# Patient Record
Sex: Female | Born: 1935 | Race: White | Hispanic: No | Marital: Single | Smoking: Former smoker
Health system: Southern US, Community
[De-identification: ages and names within clinical notes are randomized; demographics above are authoritative.]

## PROBLEM LIST (undated history)

## (undated) DIAGNOSIS — N813 Complete uterovaginal prolapse: Secondary | ICD-10-CM

## (undated) DIAGNOSIS — I10 Essential (primary) hypertension: Secondary | ICD-10-CM

## (undated) DIAGNOSIS — N289 Disorder of kidney and ureter, unspecified: Secondary | ICD-10-CM

## (undated) DIAGNOSIS — N812 Incomplete uterovaginal prolapse: Secondary | ICD-10-CM

## (undated) DIAGNOSIS — M810 Age-related osteoporosis without current pathological fracture: Secondary | ICD-10-CM

## (undated) DIAGNOSIS — Z9889 Other specified postprocedural states: Secondary | ICD-10-CM

## (undated) DIAGNOSIS — N814 Uterovaginal prolapse, unspecified: Secondary | ICD-10-CM

## (undated) DIAGNOSIS — F0390 Unspecified dementia without behavioral disturbance: Secondary | ICD-10-CM

## (undated) DIAGNOSIS — G459 Transient cerebral ischemic attack, unspecified: Secondary | ICD-10-CM

## (undated) DIAGNOSIS — Z8679 Personal history of other diseases of the circulatory system: Secondary | ICD-10-CM

## (undated) DIAGNOSIS — E039 Hypothyroidism, unspecified: Secondary | ICD-10-CM

## (undated) DIAGNOSIS — E785 Hyperlipidemia, unspecified: Secondary | ICD-10-CM

## (undated) DIAGNOSIS — K219 Gastro-esophageal reflux disease without esophagitis: Secondary | ICD-10-CM

## (undated) DIAGNOSIS — R112 Nausea with vomiting, unspecified: Secondary | ICD-10-CM

## (undated) DIAGNOSIS — N3941 Urge incontinence: Secondary | ICD-10-CM

## (undated) DIAGNOSIS — D369 Benign neoplasm, unspecified site: Secondary | ICD-10-CM

## (undated) HISTORY — DX: Hyperlipidemia, unspecified: E78.5

## (undated) HISTORY — DX: Personal history of other diseases of the circulatory system: Z86.79

## (undated) HISTORY — DX: Age-related osteoporosis without current pathological fracture: M81.0

## (undated) HISTORY — DX: Hypothyroidism, unspecified: E03.9

## (undated) HISTORY — DX: Urge incontinence: N39.41

## (undated) HISTORY — DX: Complete uterovaginal prolapse: N81.3

## (undated) HISTORY — DX: Uterovaginal prolapse, unspecified: N81.4

## (undated) HISTORY — DX: Disorder of kidney and ureter, unspecified: N28.9

## (undated) HISTORY — DX: Unspecified dementia, unspecified severity, without behavioral disturbance, psychotic disturbance, mood disturbance, and anxiety: F03.90

## (undated) HISTORY — DX: Incomplete uterovaginal prolapse: N81.2

## (undated) HISTORY — DX: Benign neoplasm, unspecified site: D36.9

---

## 1989-02-06 HISTORY — PX: CATARACT EXTRACTION W/ INTRAOCULAR LENS  IMPLANT, BILATERAL: SHX1307

## 2004-09-02 ENCOUNTER — Other Ambulatory Visit: Admission: RE | Admit: 2004-09-02 | Discharge: 2004-09-02 | Payer: Self-pay | Admitting: Internal Medicine

## 2006-06-08 HISTORY — PX: WRIST FRACTURE SURGERY: SHX121

## 2007-01-04 ENCOUNTER — Ambulatory Visit: Payer: Self-pay | Admitting: Internal Medicine

## 2007-02-08 ENCOUNTER — Ambulatory Visit: Payer: Self-pay | Admitting: Internal Medicine

## 2007-03-03 ENCOUNTER — Ambulatory Visit: Payer: Self-pay | Admitting: Internal Medicine

## 2007-03-03 DIAGNOSIS — K648 Other hemorrhoids: Secondary | ICD-10-CM | POA: Insufficient documentation

## 2007-03-03 DIAGNOSIS — K5730 Diverticulosis of large intestine without perforation or abscess without bleeding: Secondary | ICD-10-CM | POA: Insufficient documentation

## 2007-09-19 DIAGNOSIS — D126 Benign neoplasm of colon, unspecified: Secondary | ICD-10-CM

## 2007-09-19 DIAGNOSIS — E7800 Pure hypercholesterolemia, unspecified: Secondary | ICD-10-CM

## 2007-12-13 ENCOUNTER — Emergency Department (HOSPITAL_COMMUNITY): Admission: EM | Admit: 2007-12-13 | Discharge: 2007-12-14 | Payer: Self-pay | Admitting: Emergency Medicine

## 2007-12-14 ENCOUNTER — Encounter: Admission: RE | Admit: 2007-12-14 | Discharge: 2007-12-14 | Payer: Self-pay | Admitting: Orthopedic Surgery

## 2007-12-15 ENCOUNTER — Ambulatory Visit (HOSPITAL_BASED_OUTPATIENT_CLINIC_OR_DEPARTMENT_OTHER): Admission: RE | Admit: 2007-12-15 | Discharge: 2007-12-15 | Payer: Self-pay | Admitting: Orthopedic Surgery

## 2008-01-19 ENCOUNTER — Encounter: Admission: RE | Admit: 2008-01-19 | Discharge: 2008-01-19 | Payer: Self-pay | Admitting: Internal Medicine

## 2008-03-08 ENCOUNTER — Ambulatory Visit: Payer: Self-pay | Admitting: Internal Medicine

## 2008-07-19 ENCOUNTER — Ambulatory Visit: Payer: Self-pay | Admitting: Internal Medicine

## 2008-09-17 ENCOUNTER — Encounter: Admission: RE | Admit: 2008-09-17 | Discharge: 2008-11-14 | Payer: Self-pay | Admitting: Neurology

## 2009-01-21 ENCOUNTER — Ambulatory Visit: Payer: Self-pay | Admitting: Internal Medicine

## 2009-02-07 ENCOUNTER — Ambulatory Visit: Payer: Self-pay | Admitting: Internal Medicine

## 2009-02-07 ENCOUNTER — Encounter: Admission: RE | Admit: 2009-02-07 | Discharge: 2009-02-07 | Payer: Self-pay | Admitting: Internal Medicine

## 2009-02-21 ENCOUNTER — Ambulatory Visit: Payer: Self-pay | Admitting: Internal Medicine

## 2009-06-20 ENCOUNTER — Ambulatory Visit: Payer: Self-pay | Admitting: Internal Medicine

## 2009-07-23 ENCOUNTER — Ambulatory Visit: Payer: Self-pay | Admitting: Internal Medicine

## 2010-01-02 ENCOUNTER — Ambulatory Visit: Payer: Self-pay | Admitting: Internal Medicine

## 2010-01-14 ENCOUNTER — Ambulatory Visit: Payer: Self-pay | Admitting: Internal Medicine

## 2010-07-24 ENCOUNTER — Encounter (INDEPENDENT_AMBULATORY_CARE_PROVIDER_SITE_OTHER): Payer: Medicare Other | Admitting: Internal Medicine

## 2010-07-24 DIAGNOSIS — F0280 Dementia in other diseases classified elsewhere without behavioral disturbance: Secondary | ICD-10-CM

## 2010-07-24 DIAGNOSIS — E785 Hyperlipidemia, unspecified: Secondary | ICD-10-CM

## 2010-07-24 DIAGNOSIS — Z23 Encounter for immunization: Secondary | ICD-10-CM

## 2010-07-24 DIAGNOSIS — E039 Hypothyroidism, unspecified: Secondary | ICD-10-CM

## 2010-07-24 DIAGNOSIS — N182 Chronic kidney disease, stage 2 (mild): Secondary | ICD-10-CM

## 2010-08-26 ENCOUNTER — Encounter: Payer: Self-pay | Admitting: *Deleted

## 2010-09-15 ENCOUNTER — Ambulatory Visit (INDEPENDENT_AMBULATORY_CARE_PROVIDER_SITE_OTHER): Payer: Medicare Other | Admitting: Internal Medicine

## 2010-09-15 DIAGNOSIS — J209 Acute bronchitis, unspecified: Secondary | ICD-10-CM

## 2010-10-21 NOTE — Assessment & Plan Note (Signed)
[facility]                         GASTROENTEROLOGY OFFICE NOTE   WAVERLY, TARQUINIO                        MRN:          [id number]  DATE:01/04/2007                            DOB:          July 19, 1920    REASON FOR EVALUATION:  Surveillance colonoscopy.   HISTORY:  This is a pleasant, healthy 77 year old white female with a  history of adenomatous colon polyps.  She presents today regarding  surveillance colonoscopy.  Her initial colonoscopy was performed in  December of 2002.  At that time, she was found to have a large polypoid  mass in the cecum which was resected.  This was found to be a  tubulovillous adenoma with focal high grade dysplasia.  Followup in 1  year was recommended.  However, the patient did not return for followup  until January of 2005.  At that time, she was found to have multiple  adenomatous colon polyps which were small and removed.  The area of  prior polypectomy was without evidence of recurrent neoplasia.  She is  also noted to have severe diverticulosis.  At the time of her last exam,  followup in 2 years was recommended.  She presents at this time.  Currently, her GI review of systems is negative.  No nausea, vomiting,  abdominal pain, change in bowel habits or bleeding.  As well, overall  health has been good.   PAST MEDICAL HISTORY:  Hyperlipidemia.   PAST SURGICAL HISTORY:  None.   ALLERGIES:  PENICILLIN.   CURRENT MEDICATIONS:  1. Lipitor 10 mg daily.  2. Synthroid unspecified dosage daily.  3. Fosamax once weekly.   FAMILY HISTORY:  Negative for gastrointestinal malignancy.   SOCIAL HISTORY:  The patient has 4 children, lives alone, attended  college, does not smoke or use alcohol.   REVIEW OF SYSTEMS:  Per diagnostic evaluation form.   PHYSICAL EXAMINATION:  Well-appearing female in no acute distress.  Blood pressure is 142/76, heart rate is 80, weight is 149.6 pounds.  She  is 5 feet 3 inches in  height.  HEENT:  Sclerae anicteric.  Conjunctivae are pink.  Oral mucosa intact.  No adenopathy.  LUNGS:  Clear.  HEART:  Regular.  ABDOMEN:  Soft without tenderness, masses, or hernia.  Good bowel sounds  heard.   IMPRESSION:  1. An 77 year old female in excellent health with a history of      advanced adenomatous colon polyps, as well as multiple colon polyps      as discussed above.  Currently due for surveillance.  Despite her      advanced age, she is an excellent candidate without      contraindication.  We discussed the pros and cons of proceeding      with surveillance colonoscopy.  2. History of diverticulosis without diverticulitis.   PLAN:  Colonoscopy with polypectomy if necessary.  The nature of the  procedure, as well as the risks, benefits, and alternatives were again  reviewed in detail.  She understood and agreed to proceed.     Wilhemina Bonito. Marina Goodell, MD  Electronically Signed  JNP/MedQ  DD: 01/04/2007  DT: 01/05/2007  Job #: [id number]   cc:   Luanna Cole. Lenord Fellers, M.D.

## 2010-10-21 NOTE — Op Note (Signed)
[name], [name]                 ACCOUNT NO.:  [id number]   MEDICAL RECORD NO.:  [id number]          PATIENT TYPE:  AMB   LOCATION:  DSC                          FACILITY:  [facility]   PHYSICIAN:  Katy Fitch. Gay, M.D. DATE OF BIRTH:  11/18/1920   DATE OF PROCEDURE:  12/15/2007  DATE OF DISCHARGE:                               OPERATIVE REPORT   PREOPERATIVE DIAGNOSIS:  Comminuted displaced intra-articular fracture  of left distal radius with marked metaphyseal impaction and ulnar  styloid fracture.   POSTOPERATIVE DIAGNOSIS:  Comminuted displaced intra-articular fracture  of left distal radius with marked metaphyseal impaction and ulnar  styloid fracture.   OPERATIONS:  Open reduction and internal fixation of right distal radius  utilizing a 7 peg distal volar radial standard plate system.   OPERATING SURGEON:  Katy Fitch. Sypher, MD   ASSISTANT:  Laura Reeks Dasnoit, PA-C   ANESTHESIA:  Left infraclavicular block, supplemented by IV sedation.   SUPERVISING ANESTHESIOLOGIST:  Laura Person, MD   INDICATIONS:  [name] is an 77 year old right-hand dominant woman  who fell on December 13, 2007.  She was seen at the [facility] Emergency  Room where x-rays revealed a comminuted and impacted fracture of the  left distal radius.   I have treated her daughter, grandson, and multiple other family members  for upper extremity orthopedic predicaments.   She requested an upper extremity orthopedic consult.  She was seen by  [name] of the [facility] Emergency Room staff and was placed in  a sugar-tong splint, subsequently referred for follow up at orthopedic  and hand specialist.   CLINICAL EXAMINATION:  She was noted to have intact sensibility in her  median distribution, radial distribution, and ulnar distribution.  Her  motor function was intact.  Her x-rays prior to splinting revealed a  comminuted, impacted, and intra-articular displaced fracture of the left  distal  radius.   I had a lengthy informed consent with [name] and her son regarding  treatment options.  I recommended open reduction and internal fixation  utilizing a volar plate system.   Preoperatively, we advised that she may benefit from bone grafting.  After informed consent, she was brought to the operating room at this  time.   Due to other background medical issues we elected to proceed with a  regional block anesthesia, rather than a general anesthetic.   She was interviewed by [name] in the holding area and after informed  consent, had a regional block placed without complication.   PROCEDURE:  [name] was brought to the operating room and placed  in supine position on the table.   Following placement of an infraclavicular block in the holding area,  excellent anesthesia of the left upper extremity was achieved.   She was brought to room 8, placed in supine position up on the operating  table and 1 g of Ancef delivered as an IV prophylactic antibiotic.   The left arm was prepped with Betadine soap solution and sterilely  draped.  A pneumatic tourniquet was applied at proximal  left brachium.   Following exsanguination of the left arm with Esmarch bandage, arterial  tourniquet was inflated to 240 mmHg due to systolic hypertension.   Procedure commenced with exposure of the volar radius through a standard  DVR extensile exposure.  Incision was fashioned directly overlying the  flexor carpi radialis.  Subcutaneous tissues were carefully divided  taking care to identify and spare the radial artery and its superficial  branch.  The flexor pollicis longus was retracted in an ulnar direction.  The pronator quadratus was elevated and the fracture site exposed.  The  fracture was manipulated under open conditions, restoring anatomic  alignment of the articular surface and restoring the length, radial  slope, and tilt.   AP lateral and oblique images were obtained  documenting adequate  reduction followed by application of a DVR-7 peg plate system.  After  completion of the plate application, multiple images documented anatomic  reduction of the radius and restoration of radial length, tilt, and  slope.  There was excellent buttressing of the articular surface with  the peg's and screws.   The plate was secured with 4 screws to the radial shaft followed by  irrigation of the wound.  The pronator quadratus was repaired 90% over  the plate, taking care to protect the distal volar aspect of the plate.  This was repaired with a running suture of 0-Vicryl.   The wound was then irrigated a second time and repaired with  subcutaneous suture of 3-0 Vicryl and intradermal 3-0 Prolene with Steri-  Strips.   A light sugar-tong splint was applied for postoperative protection.  There were no apparent complications.   [name] tolerated the surgery and anesthesia well.  She was transferred  to recovery room with stable signs.   She will be discharged in a sling to the care of her family with  prescription for Percocet 5 mg one p.o. q.4-6 hours p.r.n. pain, also  doxycycline 100 mg p.o. b.i.d. x7 days as a prophylactic antibiotic.     Katy Fitch Gay, M.D.  Electronically Signed    RVS/MEDQ  D:  12/15/2007  T:  12/15/2007  Job:  [id number]

## 2010-10-27 ENCOUNTER — Other Ambulatory Visit: Payer: Self-pay | Admitting: *Deleted

## 2010-10-27 MED ORDER — ATORVASTATIN CALCIUM 20 MG PO TABS: 20.0000 mg | ORAL_TABLET | Freq: Every day | ORAL | Status: DC

## 2010-11-10 ENCOUNTER — Other Ambulatory Visit: Payer: Self-pay | Admitting: *Deleted

## 2010-11-10 DIAGNOSIS — E039 Hypothyroidism, unspecified: Secondary | ICD-10-CM

## 2010-11-10 MED ORDER — LEVOTHYROXINE SODIUM 100 MCG PO TABS: 100.0000 ug | ORAL_TABLET | Freq: Every day | ORAL | Status: DC

## 2011-01-20 ENCOUNTER — Telehealth: Payer: Self-pay | Admitting: Internal Medicine

## 2011-01-20 DIAGNOSIS — F0391 Unspecified dementia with behavioral disturbance: Secondary | ICD-10-CM

## 2011-01-20 NOTE — Telephone Encounter (Signed)
The number you gave me does not go through. Please check his contact number in patient's chart.

## 2011-01-22 ENCOUNTER — Encounter: Payer: Self-pay | Admitting: Internal Medicine

## 2011-01-22 ENCOUNTER — Ambulatory Visit (INDEPENDENT_AMBULATORY_CARE_PROVIDER_SITE_OTHER): Payer: Medicare Other | Admitting: Internal Medicine

## 2011-01-22 VITALS — BP 120/80 | HR 70 | Temp 98.1°F | Ht 63.0 in | Wt 142.5 lb

## 2011-01-22 DIAGNOSIS — R82998 Other abnormal findings in urine: Secondary | ICD-10-CM

## 2011-01-22 DIAGNOSIS — E785 Hyperlipidemia, unspecified: Secondary | ICD-10-CM

## 2011-01-22 DIAGNOSIS — R8290 Unspecified abnormal findings in urine: Secondary | ICD-10-CM

## 2011-01-22 DIAGNOSIS — E039 Hypothyroidism, unspecified: Secondary | ICD-10-CM

## 2011-01-22 DIAGNOSIS — F0390 Unspecified dementia without behavioral disturbance: Secondary | ICD-10-CM

## 2011-01-22 DIAGNOSIS — R32 Unspecified urinary incontinence: Secondary | ICD-10-CM

## 2011-01-22 LAB — POCT URINALYSIS DIPSTICK
Blood, UA: NEGATIVE
Glucose, UA: NEGATIVE
Nitrite, UA: NEGATIVE
Protein, UA: NEGATIVE
Spec Grav, UA: 1.02
Urobilinogen, UA: NEGATIVE
pH, UA: 5

## 2011-01-23 ENCOUNTER — Encounter: Payer: Self-pay | Admitting: Internal Medicine

## 2011-01-23 ENCOUNTER — Other Ambulatory Visit: Payer: Medicare Other | Admitting: Internal Medicine

## 2011-01-23 DIAGNOSIS — F0390 Unspecified dementia without behavioral disturbance: Secondary | ICD-10-CM | POA: Insufficient documentation

## 2011-01-23 DIAGNOSIS — E039 Hypothyroidism, unspecified: Secondary | ICD-10-CM

## 2011-01-23 DIAGNOSIS — E785 Hyperlipidemia, unspecified: Secondary | ICD-10-CM

## 2011-01-23 DIAGNOSIS — R32 Unspecified urinary incontinence: Secondary | ICD-10-CM | POA: Insufficient documentation

## 2011-01-23 NOTE — Progress Notes (Signed)
  Subjective:    Patient ID: [name], female    DOB: [birth year], 77 y.o.   MRN: [id number]  HPI 77 year old white female who currently lives alone. Her family lives in town and is supportive. She has a history of  dementia, hyperlipidemia, hypothyroidism. Her son puts her medications out for her but at times he think she may forget to take him unless he reminds her by telephone. She's had one episode of urinary incontinence on a trip recently. They're concerned about a possible urinary tract infection. Patient realizes her memory is not good. Today she knows the president, cannot name the month but says it's Fall, realizes school be starting soon since her grandchildren are in school, knows the day of the week. Cannot name the year.  Her son, Jonny Ruiz, has asked me to write a letter to help get some assistance in the home for her with per parrying meals, remembering to take her medication, laundry, etc. This has been completed. Patient no longer drives. She seemed Lipitor without assistance. Able to dress herself. Sometimes doesn't realize her clothing is soiled.    Review of Systems     Objective:   Physical Exam neck is supple without thyromegaly, no carotid bruits, chest is clear to auscultation, cardiac exam regular rate and rhythm normal S1 and S2, extremities without edema. Memory exam stated above.        Assessment & Plan:  Dementia  Hyperlipidemia  Hypothyroidism Plan: Patient has appointment see neurologist for followup next week. She is also on Namenda and Aricept. I'll see her in 6 months at which time she will have physical examination and fasting lab work. Continue Lipitor, aspirin 81 mg daily, Synthroid 0.1mg  daily

## 2011-01-24 LAB — LIPID PANEL
Cholesterol: 184 mg/dL (ref 0–200)
HDL: 45 mg/dL (ref >39)
LDL Cholesterol: 121 mg/dL — ABNORMAL HIGH (ref 0–99)
Total CHOL/HDL Ratio: 4.1 Ratio
Triglycerides: 88 mg/dL (ref <150)
VLDL: 18 mg/dL (ref 0–40)

## 2011-01-24 LAB — HEPATIC FUNCTION PANEL
ALT: 16 U/L (ref 0–35)
Albumin: 3.8 g/dL (ref 3.5–5.2)
Indirect Bilirubin: 0.4 mg/dL (ref 0.0–0.9)
Total Protein: 6.3 g/dL (ref 6.0–8.3)

## 2011-01-24 LAB — URINE CULTURE

## 2011-01-25 ENCOUNTER — Encounter: Payer: Self-pay | Admitting: Internal Medicine

## 2011-02-23 ENCOUNTER — Other Ambulatory Visit: Payer: Self-pay | Admitting: Internal Medicine

## 2011-03-05 LAB — POCT HEMOGLOBIN-HEMACUE: Hemoglobin: 13.7

## 2011-04-29 ENCOUNTER — Encounter: Payer: Self-pay | Admitting: Internal Medicine

## 2011-07-07 ENCOUNTER — Telehealth: Payer: Self-pay | Admitting: Internal Medicine

## 2011-07-07 NOTE — Telephone Encounter (Signed)
Called [name] and advised him ok to wait until 3/1 CPE unless pt develops additional symptoms.  Have pt elevate feet when sitting.  Call for appt if she develops further symptoms.  P/Dr. Lenord Fellers probably due to age and being sedentary.  [name] verbalized understanding.

## 2011-08-07 ENCOUNTER — Encounter: Payer: Self-pay | Admitting: Internal Medicine

## 2011-08-07 ENCOUNTER — Ambulatory Visit (INDEPENDENT_AMBULATORY_CARE_PROVIDER_SITE_OTHER): Payer: Medicare Other | Admitting: Internal Medicine

## 2011-08-07 DIAGNOSIS — Z0000 Encounter for general adult medical examination without abnormal findings: Secondary | ICD-10-CM

## 2011-08-07 DIAGNOSIS — R319 Hematuria, unspecified: Secondary | ICD-10-CM

## 2011-08-07 DIAGNOSIS — E039 Hypothyroidism, unspecified: Secondary | ICD-10-CM

## 2011-08-07 DIAGNOSIS — R32 Unspecified urinary incontinence: Secondary | ICD-10-CM

## 2011-08-07 DIAGNOSIS — E559 Vitamin D deficiency, unspecified: Secondary | ICD-10-CM

## 2011-08-07 DIAGNOSIS — F0390 Unspecified dementia without behavioral disturbance: Secondary | ICD-10-CM

## 2011-08-07 DIAGNOSIS — E785 Hyperlipidemia, unspecified: Secondary | ICD-10-CM

## 2011-08-07 DIAGNOSIS — Z79899 Other long term (current) drug therapy: Secondary | ICD-10-CM

## 2011-08-07 LAB — CBC WITH DIFFERENTIAL/PLATELET
Basophils Absolute: 0 10*3/uL (ref 0.0–0.1)
Basophils Relative: 1 % (ref 0–1)
Eosinophils Absolute: 0.2 10*3/uL (ref 0.0–0.7)
HCT: 38.9 % (ref 36.0–46.0)
Hemoglobin: 12.1 g/dL (ref 12.0–15.0)
MCH: 30.1 pg (ref 26.0–34.0)
MCHC: 31.1 g/dL (ref 30.0–36.0)
Monocytes Absolute: 0.8 10*3/uL (ref 0.1–1.0)
Monocytes Relative: 10 % (ref 3–12)
Neutrophils Relative %: 60 % (ref 43–77)
RDW: 14.4 % (ref 11.5–15.5)

## 2011-08-07 LAB — COMPREHENSIVE METABOLIC PANEL
Alkaline Phosphatase: 21 U/L — ABNORMAL LOW (ref 39–117)
BUN: 25 mg/dL — ABNORMAL HIGH (ref 6–23)
Creat: 1.28 mg/dL — ABNORMAL HIGH (ref 0.50–1.10)
Glucose, Bld: 76 mg/dL (ref 70–99)
Total Bilirubin: 0.5 mg/dL (ref 0.3–1.2)

## 2011-08-07 LAB — POCT URINALYSIS DIPSTICK
Bilirubin, UA: NEGATIVE
Nitrite, UA: NEGATIVE
Protein, UA: NEGATIVE
pH, UA: 6.5

## 2011-08-07 LAB — LIPID PANEL
HDL: 46 mg/dL (ref >39)
LDL Cholesterol: 109 mg/dL — ABNORMAL HIGH (ref 0–99)
Total CHOL/HDL Ratio: 3.8 Ratio
Triglycerides: 88 mg/dL (ref <150)
VLDL: 18 mg/dL (ref 0–40)

## 2011-08-09 LAB — URINE CULTURE

## 2011-08-17 ENCOUNTER — Telehealth: Payer: Self-pay

## 2011-08-17 NOTE — Telephone Encounter (Signed)
Son, Jonny Ruiz inquiring about microscopic blood in urine. How concerned should he be. Can we recheck a u/a? If its there again, what's the significance?

## 2011-08-17 NOTE — Telephone Encounter (Signed)
I am not concerned about 1+ occult blood in urine. Culture had no significant growth. If he prefers, we can repeat urine specimen and send it to [facility] lab for microscopic and repeat culture. Or, we can treat her for a presumed infection and see if incontinence gets better.

## 2011-08-17 NOTE — Progress Notes (Signed)
Subjective:    Patient ID: [name], female    DOB: Nov 01, 1920, 78 y.o.   MRN: [id number]  HPI  78 year old white female with dementia, hyperlipidemia, hypothyroidism, urinary incontinence currently living with one of her sons. She is brought to the visit today by her other son, [name], who is quite concerned about how she is doing. Her appetite is good. She gets along well in the family environment. Family is supportive. She's able to have some help come and to assist her with bathing. She doesn't like to be assisted when going to the toilet but she's having some issues with incontinence. Has been wearing Depends.  History of hearing loss, osteoporosis, adenomatous colon polyps, cervical prolapse. Prior history of urge urinary incontinence which may be what is going on now said she doesn't realize she needs to go.  She is allergic to penicillin  She had 2 benign left breast biopsies in the 1980s and cataract extractions both eyes in 1995.  Last colonoscopy was in 2008. She is on Aricept and Namenda for dementia but it doesn't seem to be of any help. Patient realizes she is confused. She does note some things like a month, her birthday, day of week. Cannot tell me the year.  Sees Dr. Anne Hahn, neurologist. Last evaluated by him 08/03/2011. Patient tolerates Aricept and Namenda well. Sleeps pre-well and doesn't really want her much at night.  Nonsmoker. No alcohol consumption. She is a widow. Has 4 children. Previously resided in [place], [place] and came to live here in [place] to be closer to her family in 2004.  She took Fosamax for a number of years but we discontinued that in 2011. Takes a baby aspirin daily 81 mg. Has had prescription for did for pain in the past for urinary incontinence. Is on Lipitor when she moved here from [place].  Family history: Father died with a CVA, mother with history of osteopenia and became bedridden and died at age 62. 2 brothers one died at 67 of suicide. 2  sons and 2 daughters in good health. Husband died at age 37 of lymphoma.  Her daughter [name] has healthcare Power of [address].  Spent 20 minutes talking with son about his concerns with her hygiene et Karie Soda.     Review of Systems  Constitutional: Negative.   HENT: Negative.   Eyes: Negative.   Respiratory: Negative.   Cardiovascular: Negative.   Gastrointestinal: Negative.   Genitourinary: Positive for urgency.       Incontinence issues  Neurological:       Memory issues  Hematological: Negative.   Psychiatric/Behavioral: Negative.        Objective:   Physical Exam  Nursing note and vitals reviewed. Constitutional: She is oriented to person, place, and time. She appears well-developed and well-nourished. No distress.  HENT:  Head: Normocephalic and atraumatic.  Right Ear: External ear normal.  Left Ear: External ear normal.  Mouth/Throat: Oropharynx is clear and moist.  Eyes: Conjunctivae and EOM are normal. Pupils are equal, round, and reactive to light. Right eye exhibits no discharge. Left eye exhibits no discharge. No scleral icterus.  Neck: Normal range of motion. Neck supple. No JVD present. No thyromegaly present.  Cardiovascular: Normal rate, regular rhythm, normal heart sounds and intact distal pulses.   No murmur heard. Pulmonary/Chest: Effort normal and breath sounds normal. She has no wheezes. She has no rales.       Breasts normal female  Abdominal: Soft. Bowel sounds are normal. She exhibits  no distension and no mass. There is no rebound.  Genitourinary:       Deferred. History of cervical prolapse.  Musculoskeletal: She exhibits no edema.  Lymphadenopathy:    She has no cervical adenopathy.  Neurological: She is alert and oriented to person, place, and time. She has normal reflexes. She displays normal reflexes. No cranial nerve deficit. Coordination normal.  Skin: Skin is warm and dry. No rash noted. She is not diaphoretic.  Psychiatric:  She has a normal mood and affect. Her behavior is normal.       Significant memory issues especially short-term memory          Assessment & Plan:  Dementia  Hyperlipidemia  Hypothyroidism  History of cervical prolapse   urinary incontinence-rule out infection. Obtain urine culture.  Osteoporosis  History of adenomatous colon polyps last colonoscopy 2008  History of hearing loss  Plan: Patient return in 6 months. Spent 20 minutes speaking with son about her condition and 40 minutes with patient.

## 2011-08-17 NOTE — Patient Instructions (Signed)
Await urine culture results. Continue same medications. Return in 6 months.

## 2011-08-17 NOTE — Telephone Encounter (Signed)
Son informed.

## 2011-08-27 ENCOUNTER — Inpatient Hospital Stay (HOSPITAL_COMMUNITY)
Admission: EM | Admit: 2011-08-27 | Discharge: 2011-08-29 | DRG: 392 | Disposition: A | Discharge status: Home / Self Care | Payer: Medicare Other | Attending: Internal Medicine | Admitting: Internal Medicine

## 2011-08-27 ENCOUNTER — Emergency Department (HOSPITAL_COMMUNITY): Payer: Medicare Other

## 2011-08-27 ENCOUNTER — Encounter (HOSPITAL_COMMUNITY): Payer: Self-pay | Admitting: *Deleted

## 2011-08-27 DIAGNOSIS — Z880 Allergy status to penicillin: Secondary | ICD-10-CM

## 2011-08-27 DIAGNOSIS — Z7982 Long term (current) use of aspirin: Secondary | ICD-10-CM

## 2011-08-27 DIAGNOSIS — R197 Diarrhea, unspecified: Secondary | ICD-10-CM | POA: Diagnosis present

## 2011-08-27 DIAGNOSIS — R5381 Other malaise: Secondary | ICD-10-CM | POA: Diagnosis present

## 2011-08-27 DIAGNOSIS — E039 Hypothyroidism, unspecified: Secondary | ICD-10-CM | POA: Diagnosis present

## 2011-08-27 DIAGNOSIS — Z79899 Other long term (current) drug therapy: Secondary | ICD-10-CM

## 2011-08-27 DIAGNOSIS — M810 Age-related osteoporosis without current pathological fracture: Secondary | ICD-10-CM | POA: Diagnosis present

## 2011-08-27 DIAGNOSIS — R531 Weakness: Secondary | ICD-10-CM | POA: Diagnosis present

## 2011-08-27 DIAGNOSIS — R112 Nausea with vomiting, unspecified: Secondary | ICD-10-CM | POA: Diagnosis present

## 2011-08-27 DIAGNOSIS — A088 Other specified intestinal infections: Principal | ICD-10-CM | POA: Diagnosis present

## 2011-08-27 DIAGNOSIS — H9190 Unspecified hearing loss, unspecified ear: Secondary | ICD-10-CM | POA: Diagnosis present

## 2011-08-27 DIAGNOSIS — N189 Chronic kidney disease, unspecified: Secondary | ICD-10-CM | POA: Diagnosis present

## 2011-08-27 DIAGNOSIS — N3941 Urge incontinence: Secondary | ICD-10-CM | POA: Diagnosis present

## 2011-08-27 DIAGNOSIS — F0390 Unspecified dementia without behavioral disturbance: Secondary | ICD-10-CM | POA: Diagnosis present

## 2011-08-27 HISTORY — DX: Other specified postprocedural states: R11.2

## 2011-08-27 HISTORY — DX: Other specified postprocedural states: Z98.890

## 2011-08-27 LAB — BASIC METABOLIC PANEL
CO2: 22 mEq/L (ref 19–32)
Chloride: 108 mEq/L (ref 96–112)
Creatinine, Ser: 1.19 mg/dL — ABNORMAL HIGH (ref 0.50–1.10)

## 2011-08-27 LAB — CBC
HCT: 34.4 % — ABNORMAL LOW (ref 36.0–46.0)
Hemoglobin: 11.3 g/dL — ABNORMAL LOW (ref 12.0–15.0)
MCV: 93.7 fL (ref 78.0–100.0)
Platelets: 260 10*3/uL (ref 150–400)
RBC: 3.67 MIL/uL — ABNORMAL LOW (ref 3.87–5.11)
WBC: 8.3 10*3/uL (ref 4.0–10.5)

## 2011-08-27 LAB — OCCULT BLOOD, POC DEVICE: Fecal Occult Bld: NEGATIVE

## 2011-08-27 MED ORDER — ONDANSETRON HCL 4 MG/2ML IJ SOLN
4.0000 mg | Freq: Four times a day (QID) | INTRAMUSCULAR | Status: DC | PRN
  Administered 2011-08-29: 4 mg via INTRAVENOUS
  Filled 2011-08-27: qty 2

## 2011-08-27 MED ORDER — POTASSIUM CHLORIDE IN NACL 20-0.9 MEQ/L-% IV SOLN
INTRAVENOUS | Status: DC
  Administered 2011-08-27 – 2011-08-28 (×2): 1000 mL via INTRAVENOUS
  Filled 2011-08-27 (×4): qty 1000

## 2011-08-27 MED ORDER — DONEPEZIL HCL 10 MG PO TABS
10.0000 mg | ORAL_TABLET | Freq: Every day | ORAL | Status: DC
  Administered 2011-08-27 – 2011-08-28 (×2): 10 mg via ORAL
  Filled 2011-08-27 (×3): qty 1

## 2011-08-27 MED ORDER — HEPARIN SODIUM (PORCINE) 5000 UNIT/ML IJ SOLN
5000.0000 [IU] | Freq: Three times a day (TID) | INTRAMUSCULAR | Status: DC
  Administered 2011-08-27 – 2011-08-29 (×5): 5000 [IU] via SUBCUTANEOUS
  Filled 2011-08-27 (×8): qty 1

## 2011-08-27 MED ORDER — MEMANTINE HCL 10 MG PO TABS
10.0000 mg | ORAL_TABLET | Freq: Two times a day (BID) | ORAL | Status: DC
  Administered 2011-08-27 – 2011-08-29 (×4): 10 mg via ORAL
  Filled 2011-08-27 (×5): qty 1

## 2011-08-27 MED ORDER — SIMVASTATIN 20 MG PO TABS
20.0000 mg | ORAL_TABLET | Freq: Every day | ORAL | Status: DC
  Administered 2011-08-27 – 2011-08-28 (×2): 20 mg via ORAL
  Filled 2011-08-27 (×3): qty 1

## 2011-08-27 MED ORDER — ASPIRIN 81 MG PO TABS
81.0000 mg | ORAL_TABLET | Freq: Every day | ORAL | Status: DC
  Administered 2011-08-27 – 2011-08-29 (×3): 81 mg via ORAL
  Filled 2011-08-27 (×3): qty 1

## 2011-08-27 MED ORDER — ACETAMINOPHEN 325 MG PO TABS: 650.0000 mg | ORAL_TABLET | Freq: Four times a day (QID) | ORAL | Status: DC | PRN

## 2011-08-27 MED ORDER — ONDANSETRON HCL 4 MG/2ML IJ SOLN
4.0000 mg | Freq: Once | INTRAMUSCULAR | Status: AC
  Administered 2011-08-27: 4 mg via INTRAVENOUS
  Filled 2011-08-27: qty 2

## 2011-08-27 MED ORDER — SODIUM CHLORIDE 0.9 % IV SOLN
Freq: Once | INTRAVENOUS | Status: AC
  Administered 2011-08-27: 18:00:00 via INTRAVENOUS

## 2011-08-27 MED ORDER — METRONIDAZOLE 500 MG PO TABS
500.0000 mg | ORAL_TABLET | Freq: Three times a day (TID) | ORAL | Status: DC
  Administered 2011-08-27 – 2011-08-28 (×2): 500 mg via ORAL
  Filled 2011-08-27 (×5): qty 1

## 2011-08-27 MED ORDER — ONDANSETRON 8 MG PO TBDP: 8.0000 mg | ORAL_TABLET | Freq: Three times a day (TID) | ORAL | Status: AC | PRN

## 2011-08-27 MED ORDER — ONDANSETRON HCL 4 MG PO TABS: 4.0000 mg | ORAL_TABLET | Freq: Four times a day (QID) | ORAL | Status: DC | PRN

## 2011-08-27 MED ORDER — ONDANSETRON HCL 4 MG/2ML IJ SOLN
INTRAMUSCULAR | Status: AC
  Administered 2011-08-27: 10:00:00
  Filled 2011-08-27: qty 2

## 2011-08-27 MED ORDER — CALCIUM CARBONATE-VITAMIN D 500-200 MG-UNIT PO TABS
1.0000 | ORAL_TABLET | Freq: Two times a day (BID) | ORAL | Status: DC
  Administered 2011-08-27 – 2011-08-29 (×4): 1 via ORAL
  Filled 2011-08-27 (×5): qty 1

## 2011-08-27 MED ORDER — LEVOTHYROXINE SODIUM 100 MCG PO TABS
100.0000 ug | ORAL_TABLET | Freq: Every day | ORAL | Status: DC
  Administered 2011-08-28 – 2011-08-29 (×2): 100 ug via ORAL
  Filled 2011-08-27 (×2): qty 1

## 2011-08-27 MED ORDER — ACETAMINOPHEN 650 MG RE SUPP: 650.0000 mg | Freq: Four times a day (QID) | RECTAL | Status: DC | PRN

## 2011-08-27 MED ORDER — SODIUM CHLORIDE 0.9 % IV BOLUS (SEPSIS)
1000.0000 mL | Freq: Once | INTRAVENOUS | Status: AC
  Administered 2011-08-27: 1000 mL via INTRAVENOUS

## 2011-08-27 NOTE — ED Provider Notes (Addendum)
History     CSN: [id number]  Arrival date & time 08/27/11  1012   First MD Initiated Contact with Patient 08/27/11 1035      Chief Complaint  Patient presents with  . Emesis  . Diarrhea    Patient is a 78 y.o. female presenting with diarrhea. The history is provided by the patient and a relative. History Limited By: Dementia.  Diarrhea The primary symptoms include vomiting and diarrhea.   as reported that the patient develops severe nausea vomiting and diarrhea today at approximately 4 AM.  She lives at home with her son.  She has baseline dementia.  There's been no report of hematemesis.  His been no report of melena or hematochezia.  Reports patient is otherwise been at baseline mental status.  Yesterday she was normal without complaints.  She ate and drank a normal diet yesterday.  Past Medical History  Diagnosis Date  . OP (osteoporosis)   . Adenomatous polyps   . Hearing loss   . Hyperlipidemia   . Urge urinary incontinence   . Hypothyroidism   . Cervical prolapse   . Renal insufficiency   . Dementia     Past Surgical History  Procedure Date  . Wrist fracture surgery 2008    No family history on file.  History  Substance Use Topics  . Smoking status: Never Smoker   . Smokeless tobacco: Never Used  . Alcohol Use: No    OB History    Grav Para Term Preterm Abortions TAB SAB Ect Mult Living                  Review of Systems  Unable to perform ROS Gastrointestinal: Positive for vomiting and diarrhea.    Allergies  Penicillins and Dilaudid  Home Medications   Current Outpatient Rx  Name Route Sig Dispense Refill  . ASPIRIN 81 MG PO TABS Oral Take 81 mg by mouth daily.      . ATORVASTATIN CALCIUM 20 MG PO TABS Oral Take 1 tablet (20 mg total) by mouth daily. 30 tablet 11  . CALCIUM CARBONATE-VITAMIN D 500-200 MG-UNIT PO TABS Oral Take 1 tablet by mouth 2 (two) times daily.      . DONEPEZIL HCL 10 MG PO TABS Oral Take 10 mg by mouth at bedtime.        Marland Kitchen LEVOTHYROXINE SODIUM 100 MCG PO TABS  TAKE ONE TABLET BY MOUTH DAILY 90 tablet 3    CYCLE FILL MEDICATION. Authorization is required f ...  . MEMANTINE HCL 10 MG PO TABS Oral Take 10 mg by mouth 2 (two) times daily.      Marland Kitchen ONDANSETRON 8 MG PO TBDP Oral Take 1 tablet (8 mg total) by mouth every 8 (eight) hours as needed for nausea. 12 tablet 0    BP 144/55  Pulse 69  Temp(Src) 97.2 F (36.2 C) (Oral)  Resp 18  Physical Exam  Nursing note and vitals reviewed. Constitutional: She appears well-developed and well-nourished. No distress.  HENT:  Head: Normocephalic and atraumatic.  Eyes: EOM are normal.  Neck: Normal range of motion.  Cardiovascular: Normal rate, regular rhythm and normal heart sounds.   Pulmonary/Chest: Effort normal and breath sounds normal.  Abdominal: Soft. She exhibits no distension. There is no tenderness.  Musculoskeletal: Normal range of motion.  Neurological: She is alert.  Skin: Skin is warm and dry.  Psychiatric: She has a normal mood and affect. Judgment normal.    ED Course  Procedures (including  critical care time)  Labs Reviewed  CBC - Abnormal; Notable for the following:    RBC 3.67 (*)    Hemoglobin 11.3 (*)    HCT 34.4 (*)    All other components within normal limits  BASIC METABOLIC PANEL - Abnormal; Notable for the following:    Glucose, Bld 108 (*)    BUN 30 (*)    Creatinine, Ser 1.19 (*)    GFR calc non Af Amer 40 (*)    GFR calc Af Amer 46 (*)    All other components within normal limits  OCCULT BLOOD, POC DEVICE  OCCULT BLOOD X 1 CARD TO LAB, STOOL   No results found.   1. Nausea vomiting and diarrhea       MDM  The patient has had no more vomiting in the ER.  She seems to be at baseline mental status.  She ambulated around the ER without difficulty.  The family is comfortable taking her home.  They understand to return to the ER for new or worsening symptoms.  They understand the importance of close primary care followup  as well.   3:49 PM The patient continues to have diffuse diarrhea.  The family is concerned that he will not be alert care for her home and that she will get her cell volume depleted again.  They report that she became extremely weak earlier today with just minimal nausea vomiting diarrhea.  The family would be more comfortable with the patient be admitted to the hospital.  I will contact the triad hospitalist for admission    Lyanne Co, MD 08/27/11 1254  Lyanne Co, MD 08/27/11 [phone]

## 2011-08-27 NOTE — H&P (Signed)
Hospital Admission Note Date: 08/27/2011  Patient name: [name]           Medical record number: [id number] Date of birth: 10-10-1920           Age: 78 y.o.   Gender: female    PCP:   Margaree Mackintosh, MD, MD   Chief Complaint:  Nausea, vomiting and diarrhea  HPI: [name] is a 78 y.o. female with past medical history of hypothyroidism, dementia and mild CKD. Patient came in to the hospital because of nausea and vomiting as well as diarrhea. Patient still restarted about 3 weeks ago when she was seen by her primary care physician for urinary urgency, per family patient got antibiotics for 7 days for her UTI. Patient did well since since then, until this morning when she developed nausea vomiting and diarrhea. With the diarrhea patient did have some incontinence. Family was very concerned about the way she decompensated, she developed extreme generalized weakness to the point she couldn't get up. She did not eat or drink anything since last night. Patient will be admitted to the hospital for further evaluation.  Past Medical History: Past Medical History  Diagnosis Date  . OP (osteoporosis)   . Adenomatous polyps   . Hearing loss   . Hyperlipidemia   . Urge urinary incontinence   . Hypothyroidism   . Cervical prolapse   . Renal insufficiency   . Dementia   . PONV (postoperative nausea and vomiting)    Past Surgical History  Procedure Date  . Wrist fracture surgery 2008    Medications: Prior to Admission medications   Medication Sig Start Date End Date Taking? Authorizing Provider  aspirin 81 MG tablet Take 81 mg by mouth daily.     Yes Historical Provider, MD  atorvastatin (LIPITOR) 20 MG tablet Take 1 tablet (20 mg total) by mouth daily. 10/27/10 10/27/11 Yes Margaree Mackintosh, MD  calcium-vitamin D (OSCAL WITH D) 500-200 MG-UNIT per tablet Take 1 tablet by mouth 2 (two) times daily.     Yes Historical Provider, MD  donepezil (ARICEPT) 10 MG tablet Take 10 mg by mouth at  bedtime.     Yes Historical Provider, MD  levothyroxine (SYNTHROID, LEVOTHROID) 100 MCG tablet TAKE ONE TABLET BY MOUTH DAILY 02/23/11  Yes Margaree Mackintosh, MD  memantine (NAMENDA) 10 MG tablet Take 10 mg by mouth 2 (two) times daily.     Yes Historical Provider, MD  ondansetron (ZOFRAN ODT) 8 MG disintegrating tablet Take 1 tablet (8 mg total) by mouth every 8 (eight) hours as needed for nausea. 08/27/11 09/03/11  Lyanne Co, MD    Allergies:   Allergies  Allergen Reactions  . Penicillins   . Dilaudid (Hydromorphone Hcl) Palpitations    Social History:  reports that she has never smoked. She has never used smokeless tobacco. She reports that she does not drink alcohol or use illicit drugs.  Family History: History reviewed. No pertinent family history.  Review of Systems:  Constitutional: negative for anorexia, fevers and sweats Eyes: negative for irritation, redness and visual disturbance Ears, nose, mouth, throat, and face: negative for earaches, epistaxis, nasal congestion and sore throat Respiratory: negative for cough, dyspnea on exertion, sputum and wheezing Cardiovascular: negative for chest pain, dyspnea, lower extremity edema, orthopnea, palpitations and syncope Gastrointestinal: negative for abdominal pain, constipation, diarrhea, melena, nausea and vomiting Genitourinary:negative for dysuria, frequency and hematuria Hematologic/lymphatic: negative for bleeding, easy bruising and lymphadenopathy Musculoskeletal:negative for arthralgias, muscle weakness  and stiff joints Neurological: negative for coordination problems, gait problems, headaches and weakness Endocrine: negative for diabetic symptoms including polydipsia, polyuria and weight loss Allergic/Immunologic: negative for anaphylaxis, hay fever and urticaria  Physical Exam: BP 144/59  Pulse 71  Temp(Src) 97.9 F (36.6 C) (Oral)  Resp 18  SpO2 100% General appearance: alert, cooperative and no distress  Head:  Normocephalic, without obvious abnormality, atraumatic  Eyes: conjunctivae/corneas clear. PERRL, EOM's intact. Fundi benign.  Nose: Nares normal. Septum midline. Mucosa normal. No drainage or sinus tenderness.  Throat: lips, mucosa, and tongue normal; teeth and gums normal  Neck: Supple, no masses, no cervical lymphadenopathy, no JVD appreciated, no meningeal signs Resp: clear to auscultation bilaterally  Chest wall: no tenderness  Cardio: regular rate and rhythm, S1, S2 normal, no murmur, click, rub or gallop  GI: soft, non-tender; bowel sounds normal; no masses, no organomegaly  Extremities: extremities normal, atraumatic, no cyanosis or edema  Skin: Skin color, texture, turgor normal. No rashes or lesions  Neurologic: Alert and oriented X 3, normal strength and tone. Normal symmetric reflexes. Normal coordination and gait  Labs on Admission:   [facility] 08/27/11 1115  NA 139  K 4.0  CL 108  CO2 22  GLUCOSE 108*  BUN 30*  CREATININE 1.19*  CALCIUM 8.6  MG --  PHOS --   Basename 08/27/11 1115  WBC 8.3  NEUTROABS --  HGB 11.3*  HCT 34.4*  MCV 93.7  PLT 260   Radiological Exams on Admission: No results found.   IMPRESSION: Present on Admission:  .Nausea and vomiting .Diarrhea .Dementia .Hypothyroidism  Assessment/Plan  Diarrhea This is can be viral gastroenteritis versus C. difficile colitis. The recent antibiotic use increases her risk to get pseudomembranous colitis. I will send her stool for culture, wbc and C. difficile PCR. Meanwhile will start her on Flagyl.  Nausea and vomiting This is likely secondary to the enteritis that patient has, we'll treat symptomatically with antiemetics.  Dementia I will continue the preadmission Namenda and Aricept.  Hypothyroidism I will check TSH, and continue preadmission levothyroxine supplementation.  [name] A 08/27/2011, 5:13 PM

## 2011-08-27 NOTE — ED Notes (Signed)
Pt had sudden onset n/v/d at 0400, vomited "a lot"--no c/o abdominal pain

## 2011-08-27 NOTE — ED Notes (Signed)
ZOX:[id number]<AV> Expected date:<BR> Expected time:<BR> Means of arrival:<BR> Comments:<BR> Ems/ n/v  Elderly

## 2011-08-27 NOTE — ED Notes (Signed)
I spoke with the hospitalist who will admit the patient. [facility]  Lyanne Co, MD 08/27/11 [phone]

## 2011-08-27 NOTE — ED Notes (Signed)
Pt given water and crackers  

## 2011-08-28 LAB — URINALYSIS, ROUTINE W REFLEX MICROSCOPIC
Ketones, ur: NEGATIVE mg/dL
Protein, ur: NEGATIVE mg/dL
Urobilinogen, UA: 0.2 mg/dL (ref 0.0–1.0)

## 2011-08-28 LAB — URINE MICROSCOPIC-ADD ON

## 2011-08-28 LAB — CLOSTRIDIUM DIFFICILE BY PCR: Toxigenic C. Difficile by PCR: NEGATIVE

## 2011-08-28 LAB — MAGNESIUM: Magnesium: 2 mg/dL (ref 1.5–2.5)

## 2011-08-28 LAB — TSH: TSH: 0.816 u[IU]/mL (ref 0.350–4.500)

## 2011-08-28 MED ORDER — HYDROCORTISONE 2.5 % RE CREA: TOPICAL_CREAM | Freq: Four times a day (QID) | RECTAL | Status: DC

## 2011-08-28 MED ORDER — SODIUM CHLORIDE 0.9 % IV SOLN
INTRAVENOUS | Status: DC
  Administered 2011-08-28 – 2011-08-29 (×2): via INTRAVENOUS

## 2011-08-28 MED ORDER — FLORA-Q PO CAPS
1.0000 | ORAL_CAPSULE | Freq: Two times a day (BID) | ORAL | Status: DC
  Administered 2011-08-28 – 2011-08-29 (×3): 1 via ORAL
  Filled 2011-08-28 (×4): qty 1

## 2011-08-28 MED ORDER — HYDROCORTISONE 2.5 % RE CREA
TOPICAL_CREAM | Freq: Four times a day (QID) | RECTAL | Status: DC | PRN
  Administered 2011-08-28: 23:00:00 via RECTAL
  Filled 2011-08-28: qty 28.35

## 2011-08-28 NOTE — Progress Notes (Signed)
Chart reviewed nexst review on [id number]

## 2011-08-28 NOTE — Progress Notes (Signed)
DAILY PROGRESS NOTE                              [facility]AL [facility]  SUBJECTIVE: Less nausea and no vomiting. Diarrhea is slowing down has only 3 bowel movements since this morning. Compared to over 15 yesterday.  OBJECTIVE: BP 154/70  Pulse 80  Temp(Src) 98.7 F (37.1 C) (Oral)  Resp 18  Ht 5' 4.5" (1.638 m)  Wt 66.1 kg (145 lb 11.6 oz)  BMI 24.63 kg/m2  SpO2 97%  Intake/Output Summary (Last 24 hours) at 08/28/11 1351 Last data filed at 08/28/11 1610  Gross per 24 hour  Intake   1665 ml  Output    400 ml  Net   1265 ml                      Weight change:  Physical Exam: General: Alert and awake oriented x3 not in any acute distress. HEENT: anicteric sclera, pupils equal reactive to light and accommodation CVS: S1-S2 heard, no murmur rubs or gallops Chest: clear to auscultation bilaterally, no wheezing rales or rhonchi Abdomen:  normal bowel sounds, soft, nontender, nondistended, no organomegaly Neuro: Cranial nerves II-XII intact, no focal neurological deficits Extremities: no cyanosis, no clubbing or edema noted bilaterally   Lab Results:  Basename 08/28/11 0350 08/27/11 1115  NA -- 139  K -- 4.0  CL -- 108  CO2 -- 22  GLUCOSE -- 108*  BUN -- 30*  CREATININE -- 1.19*  CALCIUM -- 8.6  MG 2.0 --  PHOS -- --   Basename 08/27/11 1115  WBC 8.3  NEUTROABS --  HGB 11.3*  HCT 34.4*  MCV 93.7  PLT 260   Basename 08/27/11 1942  HGBA1C 5.1   No results found for this basename: CHOL:2,HDL:2,LDLCALC:2,TRIG:2,CHOLHDL:2,LDLDIRECT:2 in the last 72 hours  Basename 08/27/11 1942  TSH 0.816  T4TOTAL --  T3FREE --  THYROIDAB --   No results found for this basename: VITAMINB12:2,FOLATE:2,FERRITIN:2,TIBC:2,IRON:2,RETICCTPCT:2 in the last 72 hours  Micro Results: Recent Results (from the past 240 hour(s))  CLOSTRIDIUM DIFFICILE BY PCR     Status: Normal   Collection Time   08/28/11  6:30 AM      Component Value Range Status Comment   C  difficile by pcr NEGATIVE  NEGATIVE  Final     Studies/Results: Abd 1 View (kub)  08/27/2011  *RADIOLOGY REPORT*  Clinical Data: Nausea and vomiting  ABDOMEN - 1 VIEW  Comparison: 02/07/2009  Findings: Gas is identified within normal caliber small and large bowel loops.  No abnormal small bowel dilatation or air-fluid levels identified.  IMPRESSION:  1.  Nonobstructive bowel gas pattern.  Original Report Authenticated By: Rosealee Albee, M.D.   Medications: Scheduled Meds:   . sodium chloride   Intravenous Once  . aspirin  81 mg Oral Daily  . calcium-vitamin D  1 tablet Oral BID  . donepezil  10 mg Oral QHS  . heparin  5,000 Units Subcutaneous Q8H  . levothyroxine  100 mcg Oral QAC breakfast  . memantine  10 mg Oral BID  . metroNIDAZOLE  500 mg Oral Q8H  . simvastatin  20 mg Oral q1800   Continuous Infusions:   . 0.9 % NaCl with KCl 20 mEq / L 1,000 mL (08/28/11 0700)   PRN Meds:.acetaminophen, acetaminophen, ondansetron (ZOFRAN) IV, ondansetron  ASSESSMENT & PLAN: Active Problems:  Hypothyroidism  Dementia  Nausea and  vomiting  Diarrhea   Nausea and vomiting -This is likely viral gastroenteritis. Patient could not hold anything yesterday she started on clear liquids will advance diet as tolerated. -Symptomatic treatment with antiemetics, overall patient feels better.  Diarrhea -Patient was started empirically on Flagyl because of recent use of antibiotics and suspicion of C. difficile colitis. -C. difficile PCR is negative I will discontinue the Flagyl.  Dementia -Stable, I will continue preadmission Namenda and Aricept.   Hypothyroidism -Stable, continue current dose of levothyroxine. TSH is 0.81   LOS: 1 day   [name] 08/28/2011, 1:51 PM

## 2011-08-29 DIAGNOSIS — R531 Weakness: Secondary | ICD-10-CM | POA: Diagnosis present

## 2011-08-29 LAB — BASIC METABOLIC PANEL
CO2: 19 mEq/L (ref 19–32)
Calcium: 8.2 mg/dL — ABNORMAL LOW (ref 8.4–10.5)
Creatinine, Ser: 1.11 mg/dL — ABNORMAL HIGH (ref 0.50–1.10)
GFR calc non Af Amer: 43 mL/min — ABNORMAL LOW (ref >90)
Sodium: 138 mEq/L (ref 135–145)

## 2011-08-29 LAB — URINE CULTURE
Colony Count: NO GROWTH
Culture  Setup Time: 201303220659
Culture: NO GROWTH

## 2011-08-29 LAB — FECAL LACTOFERRIN, QUANT: Fecal Lactoferrin: POSITIVE

## 2011-08-29 NOTE — Progress Notes (Signed)
Patient discharged to home with family in stable condition. All discharge education complete.

## 2011-08-29 NOTE — Progress Notes (Signed)
Patient was able to ambulate in the halls utilizing a rolling walker. Gait is steady and  required minimal assistance. Will continue to monitor.

## 2011-08-29 NOTE — Discharge Summary (Addendum)
HOSPITAL DISCHARGE SUMMARY  [name]  MRN: [id number]  DOB:09-03-1920  Date of Admission: 08/27/2011 Date of Discharge: 08/29/2011         LOS: 2 days   Attending Physician:  Clydia Llano A  Patient's PCP:  Margaree Mackintosh, MD, MD  Consults: None  Discharge Diagnosis: Gastroenteritis, likely viral  Present on Admission:  .Nausea and vomiting .Diarrhea .Dementia .Hypothyroidism .Generalized weakness   Medication List  As of 08/29/2011  1:12 PM   TAKE these medications         aspirin 81 MG tablet   Take 81 mg by mouth daily.      atorvastatin 20 MG tablet   Commonly known as: LIPITOR   Take 1 tablet (20 mg total) by mouth daily.      calcium-vitamin D 500-200 MG-UNIT per tablet   Commonly known as: OSCAL WITH D   Take 1 tablet by mouth 2 (two) times daily.      donepezil 10 MG tablet   Commonly known as: ARICEPT   Take 10 mg by mouth at bedtime.      levothyroxine 100 MCG tablet   Commonly known as: SYNTHROID, LEVOTHROID   TAKE ONE TABLET BY MOUTH DAILY      memantine 10 MG tablet   Commonly known as: NAMENDA   Take 10 mg by mouth 2 (two) times daily.      ondansetron 8 MG disintegrating tablet   Commonly known as: ZOFRAN-ODT   Take 1 tablet (8 mg total) by mouth every 8 (eight) hours as needed for nausea.             Brief Admission History: [name] is a 78 y.o. female with past medical history of hypothyroidism, dementia and mild CKD. Patient came in to the hospital because of nausea and vomiting as well as diarrhea. Patient still restarted about 3 weeks ago when she was seen by her primary care physician for urinary urgency, per family patient got antibiotics for 7 days for her UTI. Patient did well since since then, until this morning when she developed nausea vomiting and diarrhea. With the diarrhea patient did have some incontinence. Family was very concerned about the way she decompensated, she developed extreme generalized weakness to the  point she couldn't get up. She did not eat or drink anything since last night. Patient will be admitted to the hospital for further evaluation.  Hospital Course: Present on Admission:  .Nausea and vomiting .Diarrhea .Dementia .Hypothyroidism .Generalized weakness  1. Gastroenteritis, likely viral: Patient admitted to the hospital for intractable nausea/vomiting and diarrhea. It was thought initially it might be secondary to C. difficile colitis because of patient recent antibiotic use. C. difficile was negative by PCR. Patient was on Flagyl empirically at the time of admission. This was discontinued, patient overall status was improved without antibiotics. She was getting symptomatic treatment for the nausea and the vomiting with antiemetics. Patient did very well and improved she was able to tolerate regular diet it was felt she is okay for discharge.  2. Diarrhea: Secondary to #1. As mentioned above stool C. difficile PCR is negative. Diarrhea resolved.  3. Generalized weakness: After 2-3 days of nausea/vomiting and diarrhea patient developed severe weakness to the point she could not get up off of the bed and family was very concerned, that is why she primarily brought to the hospital. After IV fluid hydration, control of the vomiting and reintroduction of food, patient was able to ambulate with minimal assistance today.  4. Dementia: This is stable, preadmission Namenda and Aricept were continued.   Day of Discharge BP 171/68  Pulse 66  Temp(Src) 98.5 F (36.9 C) (Oral)  Resp 16  Ht 5' 4.5" (1.638 m)  Wt 66.1 kg (145 lb 11.6 oz)  BMI 24.63 kg/m2  SpO2 96% Physical Exam: GEN: No acute distress, cooperative with exam PSYCH: He is alert and oriented x4; does not appear anxious does not appear depressed; affect is normal  HEENT: Mucous membranes pink and anicteric;  Mouth: without oral thrush or lesions Eyes: PERRLA; EOM intact;  Neck: no cervical lymphadenopathy nor thyromegaly or  carotid bruit; no JVD;  CHEST WALL: No tenderness, symmetrical to breathing bilaterally CHEST: Normal respiration, clear to auscultation bilaterally  HEART: Regular rate and rhythm; no murmurs, rubs or gallops, S1 and S2 heard  BACK: No kyphosis or scoliosis; no CVA tenderness  ABDOMEN:  soft non-tender; no masses, no organomegaly, normal abdominal bowel sounds; no pannus; no intertriginous candida.  EXTREMITIES: No bone or joint deformity; no edema; no ulcerations.  PULSES: 2+ and symmetric, neurovascularity is intact SKIN: Normal hydration no rash or ulceration, no flushing or suspicious lesions  CNS: Cranial nerves 2-12 grossly intact no focal neurologic deficit, coordination is intact gait not tested    Results for orders placed during the hospital encounter of 08/27/11 (from the past 24 hour(s))  BASIC METABOLIC PANEL     Status: Abnormal   Collection Time   08/29/11  4:02 AM      Component Value Range   Sodium 138  135 - 145 (mEq/L)   Potassium 3.5  3.5 - 5.1 (mEq/L)   Chloride 111  96 - 112 (mEq/L)   CO2 19  19 - 32 (mEq/L)   Glucose, Bld 88  70 - 99 (mg/dL)   BUN 15  6 - 23 (mg/dL)   Creatinine, Ser 2.13 (*) 0.50 - 1.10 (mg/dL)   Calcium 8.2 (*) 8.4 - 10.5 (mg/dL)   GFR calc non Af Amer 43 (*) >90 (mL/min)   GFR calc Af Amer 50 (*) >90 (mL/min)    Disposition: Home   Follow-up Appts: Discharge Orders    Future Appointments: Provider: Department: Dept Phone: Center:   08/31/2011 3:45 PM Margaree Mackintosh, MD [facility] [phone] [facility]     Future Orders Please Complete By Expires   Diet general      Increase activity slowly         I spent 40 minutes completing paperwork and coordinating discharge efforts.  SignedClydia Llano A 08/29/2011, 1:12 PM

## 2011-08-31 ENCOUNTER — Encounter: Payer: Self-pay | Admitting: Internal Medicine

## 2011-08-31 ENCOUNTER — Ambulatory Visit (INDEPENDENT_AMBULATORY_CARE_PROVIDER_SITE_OTHER): Payer: Medicare Other | Admitting: Internal Medicine

## 2011-08-31 VITALS — BP 128/76 | HR 88 | Temp 98.8°F | Wt 142.0 lb

## 2011-08-31 DIAGNOSIS — K644 Residual hemorrhoidal skin tags: Secondary | ICD-10-CM

## 2011-08-31 DIAGNOSIS — N814 Uterovaginal prolapse, unspecified: Secondary | ICD-10-CM

## 2011-08-31 DIAGNOSIS — E860 Dehydration: Secondary | ICD-10-CM

## 2011-08-31 DIAGNOSIS — R32 Unspecified urinary incontinence: Secondary | ICD-10-CM

## 2011-08-31 DIAGNOSIS — L22 Diaper dermatitis: Secondary | ICD-10-CM

## 2011-08-31 DIAGNOSIS — E869 Volume depletion, unspecified: Secondary | ICD-10-CM

## 2011-08-31 DIAGNOSIS — F0390 Unspecified dementia without behavioral disturbance: Secondary | ICD-10-CM

## 2011-08-31 LAB — COMPREHENSIVE METABOLIC PANEL
ALT: 52 U/L — ABNORMAL HIGH (ref 0–35)
AST: 72 U/L — ABNORMAL HIGH (ref 0–37)
Creat: 1.21 mg/dL — ABNORMAL HIGH (ref 0.50–1.10)
Sodium: 142 mEq/L (ref 135–145)
Total Bilirubin: 0.5 mg/dL (ref 0.3–1.2)
Total Protein: 6.5 g/dL (ref 6.0–8.3)

## 2011-08-31 LAB — CBC WITH DIFFERENTIAL/PLATELET
Basophils Absolute: 0 10*3/uL (ref 0.0–0.1)
Eosinophils Absolute: 0.1 10*3/uL (ref 0.0–0.7)
Eosinophils Relative: 1 % (ref 0–5)
Lymphocytes Relative: 20 % (ref 12–46)
MCH: 30.2 pg (ref 26.0–34.0)
MCV: 92.5 fL (ref 78.0–100.0)
Neutrophils Relative %: 68 % (ref 43–77)
Platelets: 287 10*3/uL (ref 150–400)
RBC: 4.24 MIL/uL (ref 3.87–5.11)
RDW: 14.1 % (ref 11.5–15.5)
WBC: 6.7 10*3/uL (ref 4.0–10.5)

## 2011-09-01 LAB — STOOL CULTURE

## 2011-09-02 NOTE — Progress Notes (Signed)
Patient's son informed. Will bring her back for 3 week recheck

## 2011-09-05 NOTE — Patient Instructions (Signed)
Use creams in rectal area as prescribed. Try to stay well-hydrated. Wants diaper rash and hemorrhoid irritation resolve, we can refer you to gynecologist for evaluation of cervical prolapse.

## 2011-09-05 NOTE — Progress Notes (Signed)
  Subjective:    Patient ID: [name], female    DOB: May 23, 1921, 78 y.o.   MRN: [id number]  HPI patient developed severe gastroenteritis symptoms late last week. She was advised, office staff to go the emergency department on March 21 where she was hospitalized for couple of nights receiving IV fluids. She had been treated recently for urinary incontinence with an antibiotic for presumed urinary tract infectiondespite culture having insignificant growth. However her C. Difficile toxin assay was negative.She is here todayfor hospital followup. She comes in today by wheelchair. She is still quite weak and needs a great deal of assistance. She has a full-time caretaker. Complaint today is that her rectal area is raw from the diarrhea. Also is having some issues with what sounds like prolapse of the cervix and external hemorrhoids.   Review of Systems     Objective:   Physical Exam frail white female in no acute distress but very weak and unstable with ambulation. Has to be helped from scale to wheelchair. Chest is clear to auscultation; cardiac exam regular rate and rhythm. Extremities without edema. She has significant erythema about her. Rectal area. Has significant large external hemorrhoids which are tender. She has a prolapsed cervix.        Assessment & Plan:  Cervical prolapse  Gastroenteritis-resolving. Has little appetite. Needs to stay well-hydrated. Likely had Norovirus.  Dementia  External hemorrhoids that are inflamed  Perirectal dermatitis from gastroenteritis  Plan: "Fanny Cream"  ordered for perirectal irritation. ProctoCream-HC ordered for external hemorrhoid inflammation. Stay well hydrated and try soft diet.  Wants gastroenteritis symptoms and hemorrhoids as well as rectal dermatitis resolve, we can address cervical prolapse.

## 2011-09-10 ENCOUNTER — Ambulatory Visit (INDEPENDENT_AMBULATORY_CARE_PROVIDER_SITE_OTHER): Payer: Medicare Other | Admitting: Internal Medicine

## 2011-09-10 ENCOUNTER — Encounter: Payer: Self-pay | Admitting: Internal Medicine

## 2011-09-10 VITALS — BP 180/96 | HR 80 | Temp 98.3°F | Wt 142.0 lb

## 2011-09-10 DIAGNOSIS — N814 Uterovaginal prolapse, unspecified: Secondary | ICD-10-CM

## 2011-09-10 DIAGNOSIS — J40 Bronchitis, not specified as acute or chronic: Secondary | ICD-10-CM

## 2011-09-10 DIAGNOSIS — F0390 Unspecified dementia without behavioral disturbance: Secondary | ICD-10-CM

## 2011-09-10 NOTE — Patient Instructions (Signed)
Take Zithromax Z-PAK as directed. We will arrange gynecology referral to evaluate cervical prolapse.

## 2011-09-10 NOTE — Progress Notes (Signed)
  Subjective:    Patient ID: [name], female    DOB: 23-Nov-1920, 78 y.o.   MRN: [id number]  HPI 78 year old white female returns today complaining of cough. Has been coughing since she left the hospital where she was hospitalized with gastroenteritis. C. difficile toxin was negative. It was believed she had a viral gastroenteritis (Norovirus). She also developed a severe diaper rash that has improved with Fanny cream. It was also discovered that she had a cervical prolapse at last visit. No fever. No certain time the cough starts. No respiratory distress. She can't tell me if she produces sputum or not. History of dementia.    Review of Systems     Objective:   Physical Exam HEENT exam: Tongue is dry, pharynx is clear, TMs are clear, neck is supple without adenopathy, chest clear to auscultation. Cervical prolapse noted. Patient was encouraged coughing cough is deep and congested.        Assessment & Plan:  Bronchitis  Recent bout of viral gastroenteritis  Cervical prolapse  Dementia  Plan: Zithromax Z-Pak take 2 tablets by mouth day one followed by 1 tablet by mouth days 2 through 5.

## 2011-09-11 ENCOUNTER — Telehealth: Payer: Self-pay

## 2011-09-11 NOTE — Telephone Encounter (Signed)
Patient scheduled for a n appointment with Dr. Dalia Heading on 10/06/2011 at 2:00 pm for cervical prolapse. Informed of this date and time, Records ready to be faxed.

## 2011-09-22 ENCOUNTER — Ambulatory Visit: Payer: Medicare Other | Admitting: Internal Medicine

## 2011-11-19 ENCOUNTER — Other Ambulatory Visit: Payer: Self-pay | Admitting: Internal Medicine

## 2011-11-30 ENCOUNTER — Inpatient Hospital Stay (HOSPITAL_COMMUNITY)
Admission: EM | Admit: 2011-11-30 | Discharge: 2011-12-04 | DRG: 391 | Disposition: A | Discharge status: Skilled Nursing Facility | Payer: Medicare Other | Source: Ambulatory Visit | Attending: Family Medicine | Admitting: Family Medicine

## 2011-11-30 ENCOUNTER — Other Ambulatory Visit: Payer: Self-pay

## 2011-11-30 ENCOUNTER — Encounter (HOSPITAL_COMMUNITY): Payer: Self-pay | Admitting: Emergency Medicine

## 2011-11-30 DIAGNOSIS — B961 Klebsiella pneumoniae [K. pneumoniae] as the cause of diseases classified elsewhere: Secondary | ICD-10-CM | POA: Diagnosis present

## 2011-11-30 DIAGNOSIS — R112 Nausea with vomiting, unspecified: Principal | ICD-10-CM | POA: Diagnosis present

## 2011-11-30 DIAGNOSIS — S32401A Unspecified fracture of right acetabulum, initial encounter for closed fracture: Secondary | ICD-10-CM | POA: Diagnosis present

## 2011-11-30 DIAGNOSIS — M25511 Pain in right shoulder: Secondary | ICD-10-CM | POA: Diagnosis present

## 2011-11-30 DIAGNOSIS — N183 Chronic kidney disease, stage 3 unspecified: Secondary | ICD-10-CM | POA: Diagnosis present

## 2011-11-30 DIAGNOSIS — E039 Hypothyroidism, unspecified: Secondary | ICD-10-CM | POA: Diagnosis present

## 2011-11-30 DIAGNOSIS — M719 Bursopathy, unspecified: Secondary | ICD-10-CM | POA: Diagnosis present

## 2011-11-30 DIAGNOSIS — S32409A Unspecified fracture of unspecified acetabulum, initial encounter for closed fracture: Secondary | ICD-10-CM | POA: Diagnosis present

## 2011-11-30 DIAGNOSIS — H9190 Unspecified hearing loss, unspecified ear: Secondary | ICD-10-CM | POA: Diagnosis present

## 2011-11-30 DIAGNOSIS — N179 Acute kidney failure, unspecified: Secondary | ICD-10-CM | POA: Diagnosis present

## 2011-11-30 DIAGNOSIS — M810 Age-related osteoporosis without current pathological fracture: Secondary | ICD-10-CM | POA: Diagnosis present

## 2011-11-30 DIAGNOSIS — Z66 Do not resuscitate: Secondary | ICD-10-CM | POA: Diagnosis not present

## 2011-11-30 DIAGNOSIS — E785 Hyperlipidemia, unspecified: Secondary | ICD-10-CM | POA: Diagnosis present

## 2011-11-30 DIAGNOSIS — M7580 Other shoulder lesions, unspecified shoulder: Secondary | ICD-10-CM

## 2011-11-30 DIAGNOSIS — M67919 Unspecified disorder of synovium and tendon, unspecified shoulder: Secondary | ICD-10-CM | POA: Diagnosis present

## 2011-11-30 DIAGNOSIS — N390 Urinary tract infection, site not specified: Secondary | ICD-10-CM | POA: Diagnosis present

## 2011-11-30 DIAGNOSIS — F0390 Unspecified dementia without behavioral disturbance: Secondary | ICD-10-CM | POA: Diagnosis present

## 2011-11-30 DIAGNOSIS — D649 Anemia, unspecified: Secondary | ICD-10-CM | POA: Diagnosis present

## 2011-11-30 DIAGNOSIS — Z87891 Personal history of nicotine dependence: Secondary | ICD-10-CM

## 2011-11-30 DIAGNOSIS — I129 Hypertensive chronic kidney disease with stage 1 through stage 4 chronic kidney disease, or unspecified chronic kidney disease: Secondary | ICD-10-CM | POA: Diagnosis present

## 2011-11-30 DIAGNOSIS — W19XXXA Unspecified fall, initial encounter: Secondary | ICD-10-CM | POA: Diagnosis present

## 2011-11-30 DIAGNOSIS — T40605A Adverse effect of unspecified narcotics, initial encounter: Secondary | ICD-10-CM | POA: Diagnosis present

## 2011-11-30 DIAGNOSIS — N8110 Cystocele, unspecified: Secondary | ICD-10-CM | POA: Diagnosis present

## 2011-11-30 HISTORY — DX: Gastro-esophageal reflux disease without esophagitis: K21.9

## 2011-11-30 HISTORY — DX: Essential (primary) hypertension: I10

## 2011-11-30 HISTORY — DX: Transient cerebral ischemic attack, unspecified: G45.9

## 2011-11-30 MED ORDER — ONDANSETRON HCL 4 MG/2ML IJ SOLN
INTRAMUSCULAR | Status: AC
  Filled 2011-11-30: qty 2

## 2011-11-30 MED ORDER — ONDANSETRON HCL 4 MG/2ML IJ SOLN
INTRAMUSCULAR | Status: AC
  Administered 2011-11-30: 4 mg
  Filled 2011-11-30: qty 2

## 2011-11-30 NOTE — ED Notes (Signed)
PT. ARRIVED WITH EMS FROM HOME , HOME HEALTH CAREGIVER REPORTS NAUSEA AND VOMITTING THIS EVENING , DENIES ABDOMINAL PAIN , PT. RECEIVED ZOFRAN 4 MG IV BY EMS , PT. HAS DEMENTIA , CAREGIVER STATES PT. RECEIVED TRAMADOL FOR RIGHT ARM PAIN THIS EVENING PRIOR TO NAUSEA.

## 2011-11-30 NOTE — ED Notes (Signed)
Per caregiver pt became sweaty and dizzy and slid off her bed onto floor. Pt now complains of nausea. Per caregiver pt started tramadol today and had 1 50 mg pill at 1500 and another about 1930.

## 2011-12-01 ENCOUNTER — Encounter (HOSPITAL_COMMUNITY): Payer: Self-pay | Admitting: Internal Medicine

## 2011-12-01 ENCOUNTER — Inpatient Hospital Stay (HOSPITAL_COMMUNITY): Payer: Medicare Other

## 2011-12-01 DIAGNOSIS — N390 Urinary tract infection, site not specified: Secondary | ICD-10-CM | POA: Diagnosis present

## 2011-12-01 DIAGNOSIS — E039 Hypothyroidism, unspecified: Secondary | ICD-10-CM

## 2011-12-01 DIAGNOSIS — E782 Mixed hyperlipidemia: Secondary | ICD-10-CM

## 2011-12-01 DIAGNOSIS — R112 Nausea with vomiting, unspecified: Secondary | ICD-10-CM

## 2011-12-01 LAB — COMPREHENSIVE METABOLIC PANEL
ALT: 18 U/L (ref 0–35)
Alkaline Phosphatase: 22 U/L — ABNORMAL LOW (ref 39–117)
BUN: 33 mg/dL — ABNORMAL HIGH (ref 6–23)
BUN: 34 mg/dL — ABNORMAL HIGH (ref 6–23)
CO2: 26 mEq/L (ref 19–32)
Calcium: 9.3 mg/dL (ref 8.4–10.5)
Chloride: 103 mEq/L (ref 96–112)
Creatinine, Ser: 1.51 mg/dL — ABNORMAL HIGH (ref 0.50–1.10)
Creatinine, Ser: 1.52 mg/dL — ABNORMAL HIGH (ref 0.50–1.10)
GFR calc Af Amer: 34 mL/min — ABNORMAL LOW (ref >90)
GFR calc Af Amer: 34 mL/min — ABNORMAL LOW (ref >90)
GFR calc non Af Amer: 29 mL/min — ABNORMAL LOW (ref >90)
GFR calc non Af Amer: 29 mL/min — ABNORMAL LOW (ref >90)
Glucose, Bld: 122 mg/dL — ABNORMAL HIGH (ref 70–99)
Glucose, Bld: 150 mg/dL — ABNORMAL HIGH (ref 70–99)
Potassium: 3.9 mEq/L (ref 3.5–5.1)
Sodium: 143 mEq/L (ref 135–145)
Total Bilirubin: 0.3 mg/dL (ref 0.3–1.2)

## 2011-12-01 LAB — DIFFERENTIAL
Basophils Absolute: 0 10*3/uL (ref 0.0–0.1)
Basophils Relative: 0 % (ref 0–1)
Eosinophils Relative: 0 % (ref 0–5)
Eosinophils Relative: 1 % (ref 0–5)
Lymphocytes Relative: 12 % (ref 12–46)
Lymphs Abs: 1.2 10*3/uL (ref 0.7–4.0)
Lymphs Abs: 1.6 10*3/uL (ref 0.7–4.0)
Monocytes Absolute: 0.7 10*3/uL (ref 0.1–1.0)
Neutro Abs: 7.6 10*3/uL (ref 1.7–7.7)
Neutro Abs: 9.3 10*3/uL — ABNORMAL HIGH (ref 1.7–7.7)
Neutrophils Relative %: 79 % — ABNORMAL HIGH (ref 43–77)

## 2011-12-01 LAB — URINALYSIS, ROUTINE W REFLEX MICROSCOPIC
Hgb urine dipstick: NEGATIVE
Protein, ur: NEGATIVE mg/dL
Urobilinogen, UA: 0.2 mg/dL (ref 0.0–1.0)

## 2011-12-01 LAB — CBC
HCT: 34 % — ABNORMAL LOW (ref 36.0–46.0)
Hemoglobin: 11.1 g/dL — ABNORMAL LOW (ref 12.0–15.0)
MCH: 30.2 pg (ref 26.0–34.0)
MCV: 92.4 fL (ref 78.0–100.0)
Platelets: 218 10*3/uL (ref 150–400)
RBC: 3.55 MIL/uL — ABNORMAL LOW (ref 3.87–5.11)
RBC: 3.68 MIL/uL — ABNORMAL LOW (ref 3.87–5.11)
RDW: 14.7 % (ref 11.5–15.5)
WBC: 9.5 10*3/uL (ref 4.0–10.5)

## 2011-12-01 LAB — TSH: TSH: 0.416 u[IU]/mL (ref 0.350–4.500)

## 2011-12-01 LAB — URINE MICROSCOPIC-ADD ON

## 2011-12-01 LAB — LACTIC ACID, PLASMA: Lactic Acid, Venous: 0.9 mmol/L (ref 0.5–2.2)

## 2011-12-01 LAB — LIPASE, BLOOD: Lipase: 34 U/L (ref 11–59)

## 2011-12-01 MED ORDER — DIPHENHYDRAMINE HCL 50 MG/ML IJ SOLN
25.0000 mg | Freq: Four times a day (QID) | INTRAMUSCULAR | Status: DC | PRN
  Administered 2011-12-01: 25 mg via INTRAVENOUS
  Filled 2011-12-01: qty 1

## 2011-12-01 MED ORDER — LEVOFLOXACIN IN D5W 500 MG/100ML IV SOLN: 500.0000 mg | INTRAVENOUS | Status: DC

## 2011-12-01 MED ORDER — ASPIRIN 81 MG PO CHEW
81.0000 mg | CHEWABLE_TABLET | Freq: Every day | ORAL | Status: DC
  Administered 2011-12-01 – 2011-12-04 (×4): 81 mg via ORAL
  Filled 2011-12-01 (×5): qty 1

## 2011-12-01 MED ORDER — CIPROFLOXACIN IN D5W 400 MG/200ML IV SOLN
400.0000 mg | Freq: Once | INTRAVENOUS | Status: DC
  Administered 2011-12-01: 400 mg via INTRAVENOUS
  Filled 2011-12-01: qty 200

## 2011-12-01 MED ORDER — ESTROGENS, CONJUGATED 0.625 MG/GM VA CREA
1.0000 | TOPICAL_CREAM | Freq: Every day | VAGINAL | Status: DC
  Administered 2011-12-02 – 2011-12-03 (×2): 1 via VAGINAL
  Filled 2011-12-01 (×2): qty 42.5

## 2011-12-01 MED ORDER — MOXIFLOXACIN HCL IN NACL 400 MG/250ML IV SOLN: 400.0000 mg | INTRAVENOUS | Status: DC

## 2011-12-01 MED ORDER — LEVOTHYROXINE SODIUM 100 MCG PO TABS
100.0000 ug | ORAL_TABLET | Freq: Every day | ORAL | Status: DC
  Administered 2011-12-01 – 2011-12-04 (×4): 100 ug via ORAL
  Filled 2011-12-01 (×5): qty 1

## 2011-12-01 MED ORDER — ENOXAPARIN SODIUM 30 MG/0.3ML ~~LOC~~ SOLN
30.0000 mg | SUBCUTANEOUS | Status: DC
  Administered 2011-12-01 – 2011-12-04 (×4): 30 mg via SUBCUTANEOUS
  Filled 2011-12-01 (×4): qty 0.3

## 2011-12-01 MED ORDER — SODIUM CHLORIDE 0.9 % IV SOLN
500.0000 mg | Freq: Once | INTRAVENOUS | Status: DC
  Filled 2011-12-01: qty 0.5

## 2011-12-01 MED ORDER — MEMANTINE HCL 10 MG PO TABS
10.0000 mg | ORAL_TABLET | Freq: Two times a day (BID) | ORAL | Status: DC
  Administered 2011-12-01 – 2011-12-04 (×7): 10 mg via ORAL
  Filled 2011-12-01 (×8): qty 1

## 2011-12-01 MED ORDER — ATORVASTATIN CALCIUM 20 MG PO TABS
20.0000 mg | ORAL_TABLET | Freq: Every evening | ORAL | Status: DC
  Administered 2011-12-01 – 2011-12-03 (×3): 20 mg via ORAL
  Filled 2011-12-01 (×4): qty 1

## 2011-12-01 MED ORDER — SODIUM CHLORIDE 0.9 % IV BOLUS (SEPSIS)
500.0000 mL | Freq: Once | INTRAVENOUS | Status: AC
  Administered 2011-12-03: 500 mL via INTRAVENOUS

## 2011-12-01 MED ORDER — ACETAMINOPHEN 325 MG PO TABS
650.0000 mg | ORAL_TABLET | Freq: Four times a day (QID) | ORAL | Status: DC | PRN
  Administered 2011-12-01 – 2011-12-02 (×2): 650 mg via ORAL
  Filled 2011-12-01 (×2): qty 2

## 2011-12-01 MED ORDER — DONEPEZIL HCL 10 MG PO TABS
10.0000 mg | ORAL_TABLET | Freq: Every day | ORAL | Status: DC
  Administered 2011-12-01 – 2011-12-03 (×4): 10 mg via ORAL
  Filled 2011-12-01 (×4): qty 1

## 2011-12-01 MED ORDER — PANTOPRAZOLE SODIUM 40 MG PO TBEC
40.0000 mg | DELAYED_RELEASE_TABLET | Freq: Every day | ORAL | Status: DC
  Administered 2011-12-01 – 2011-12-04 (×4): 40 mg via ORAL
  Filled 2011-12-01 (×4): qty 1

## 2011-12-01 MED ORDER — ONDANSETRON HCL 4 MG/2ML IJ SOLN: 4.0000 mg | Freq: Once | INTRAMUSCULAR | Status: DC

## 2011-12-01 MED ORDER — CALCIUM CARBONATE-VITAMIN D 500-200 MG-UNIT PO TABS
1.0000 | ORAL_TABLET | Freq: Two times a day (BID) | ORAL | Status: DC
  Administered 2011-12-01 – 2011-12-04 (×7): 1 via ORAL
  Filled 2011-12-01 (×9): qty 1

## 2011-12-01 MED ORDER — DEXTROSE 5 % IV SOLN
1.0000 g | INTRAVENOUS | Status: DC
  Administered 2011-12-01 – 2011-12-04 (×4): 1 g via INTRAVENOUS
  Filled 2011-12-01 (×4): qty 10

## 2011-12-01 MED ORDER — CHLORTHALIDONE 25 MG PO TABS
25.0000 mg | ORAL_TABLET | Freq: Every morning | ORAL | Status: DC
  Administered 2011-12-01 – 2011-12-03 (×3): 25 mg via ORAL
  Filled 2011-12-01 (×3): qty 1

## 2011-12-01 MED ORDER — SODIUM CHLORIDE 0.9 % IV SOLN: 500.0000 mg | Freq: Three times a day (TID) | INTRAVENOUS | Status: DC

## 2011-12-01 MED ORDER — SODIUM CHLORIDE 0.9 % IV SOLN
500.0000 mg | Freq: Two times a day (BID) | INTRAVENOUS | Status: DC
  Filled 2011-12-01 (×2): qty 0.5

## 2011-12-01 NOTE — H&P (Addendum)
[name] is an 78 y.o. female.  '  [name] (pcp) Rita Ohara (pcp)   Chief Complaint: shoulder pain HPI: 78 yo female with dementia, c/o right shoulder pain took tramadol =>upset stomach, and brought to ER and found to have uti./  Ed requests admission for uti.   Past Medical History  Diagnosis Date  . OP (osteoporosis)   . Adenomatous polyps   . Hearing loss   . Hyperlipidemia   . Urge urinary incontinence   . Hypothyroidism   . Cervical prolapse   . Renal insufficiency   . Dementia   . PONV (postoperative nausea and vomiting)   . Dementia   . Hypertension     Past Surgical History  Procedure Date  . Wrist fracture surgery 2008  . Cataract extraction     Family History  Problem Relation Age of Onset  . Osteoporosis Mother   . Dementia Father    Social History:  reports that she quit smoking about 63 years ago. Her smoking use included Cigarettes. She has a .5 pack-year smoking history. She has never used smokeless tobacco. She reports that she does not drink alcohol or use illicit drugs.  Allergies:  Allergies  Allergen Reactions  . Dilaudid (Hydromorphone Hcl) Palpitations  . Penicillins Rash     (Not in a hospital admission)  Results for orders placed during the hospital encounter of 11/30/11 (from the past 48 hour(s))  GLUCOSE, CAPILLARY     Status: Abnormal   Collection Time   11/30/11 11:31 PM      Component Value Range Comment   Glucose-Capillary 117 (*) 70 - 99 mg/dL    Comment 1 Notify RN     CBC     Status: Abnormal   Collection Time   12/01/11 12:03 AM      Component Value Range Comment   WBC 11.7 (*) 4.0 - 10.5 K/uL    RBC 3.68 (*) 3.87 - 5.11 MIL/uL    Hemoglobin 11.1 (*) 12.0 - 15.0 g/dL    HCT 16.1 (*) 09.6 - 46.0 %    MCV 92.4  78.0 - 100.0 fL    MCH 30.2  26.0 - 34.0 pg    MCHC 32.6  30.0 - 36.0 g/dL    RDW 04.5  40.9 - 81.1 %    Platelets 214  150 - 400 K/uL   DIFFERENTIAL     Status: Abnormal   Collection Time   12/01/11  12:03 AM      Component Value Range Comment   Neutrophils Relative 79 (*) 43 - 77 %    Lymphocytes Relative 14  12 - 46 %    Monocytes Relative 6  3 - 12 %    Eosinophils Relative 1  0 - 5 %    Basophils Relative 0  0 - 1 %    Neutro Abs 9.3 (*) 1.7 - 7.7 K/uL    Lymphs Abs 1.6  0.7 - 4.0 K/uL    Monocytes Absolute 0.7  0.1 - 1.0 K/uL    Eosinophils Absolute 0.1  0.0 - 0.7 K/uL    Basophils Absolute 0.0  0.0 - 0.1 K/uL    RBC Morphology POLYCHROMASIA PRESENT     COMPREHENSIVE METABOLIC PANEL     Status: Abnormal   Collection Time   12/01/11 12:03 AM      Component Value Range Comment   Sodium 143  135 - 145 mEq/L    Potassium 3.5  3.5 - 5.1  mEq/L    Chloride 106  96 - 112 mEq/L    CO2 26  19 - 32 mEq/L    Glucose, Bld 150 (*) 70 - 99 mg/dL    BUN 34 (*) 6 - 23 mg/dL    Creatinine, Ser 4.54 (*) 0.50 - 1.10 mg/dL    Calcium 9.3  8.4 - 09.8 mg/dL    Total Protein 6.7  6.0 - 8.3 g/dL    Albumin 3.3 (*) 3.5 - 5.2 g/dL    AST 24  0 - 37 U/L    ALT 18  0 - 35 U/L    Alkaline Phosphatase 21 (*) 39 - 117 U/L    Total Bilirubin 0.3  0.3 - 1.2 mg/dL    GFR calc non Af Amer 29 (*) >90 mL/min    GFR calc Af Amer 34 (*) >90 mL/min   LIPASE, BLOOD     Status: Normal   Collection Time   12/01/11 12:03 AM      Component Value Range Comment   Lipase 34  11 - 59 U/L   LACTIC ACID, PLASMA     Status: Normal   Collection Time   12/01/11 12:08 AM      Component Value Range Comment   Lactic Acid, Venous 0.9  0.5 - 2.2 mmol/L   URINALYSIS, ROUTINE W REFLEX MICROSCOPIC     Status: Abnormal   Collection Time   12/01/11 12:33 AM      Component Value Range Comment   Color, Urine YELLOW  YELLOW    APPearance CLOUDY (*) CLEAR    Specific Gravity, Urine 1.011  1.005 - 1.030    pH 7.0  5.0 - 8.0    Glucose, UA NEGATIVE  NEGATIVE mg/dL    Hgb urine dipstick NEGATIVE  NEGATIVE    Bilirubin Urine NEGATIVE  NEGATIVE    Ketones, ur NEGATIVE  NEGATIVE mg/dL    Protein, ur NEGATIVE  NEGATIVE mg/dL     Urobilinogen, UA 0.2  0.0 - 1.0 mg/dL    Nitrite NEGATIVE  NEGATIVE    Leukocytes, UA SMALL (*) NEGATIVE   URINE MICROSCOPIC-ADD ON     Status: Abnormal   Collection Time   12/01/11 12:33 AM      Component Value Range Comment   Squamous Epithelial / LPF RARE  RARE    WBC, UA 7-10  <3 WBC/hpf    RBC / HPF 0-2  <3 RBC/hpf    Bacteria, UA MANY (*) RARE    No results found.  Review of Systems  Constitutional: Negative for fever, chills, weight loss, malaise/fatigue and diaphoresis.  HENT: Negative for hearing loss, ear pain, nosebleeds, congestion, neck pain, tinnitus and ear discharge.   Eyes: Negative for blurred vision, double vision, photophobia, pain, discharge and redness.  Respiratory: Negative for cough, hemoptysis, sputum production, shortness of breath and wheezing.   Cardiovascular: Negative for chest pain, palpitations, orthopnea, claudication, leg swelling and PND.  Gastrointestinal: Positive for nausea and vomiting. Negative for heartburn, abdominal pain, diarrhea, constipation, blood in stool and melena.  Genitourinary: Negative for dysuria, urgency, frequency, hematuria and flank pain.  Musculoskeletal: Negative for myalgias, back pain, joint pain and falls.  Skin: Negative for itching and rash.  Neurological: Negative for dizziness, tingling, tremors, sensory change, speech change, focal weakness, seizures, loss of consciousness, weakness and headaches.  Endo/Heme/Allergies: Negative for environmental allergies and polydipsia. Does not bruise/bleed easily.  Psychiatric/Behavioral: Negative for depression, suicidal ideas, hallucinations, memory loss and substance abuse. The patient  is not nervous/anxious and does not have insomnia.     Blood pressure 143/53, pulse 51, temperature 94.3 F (34.6 C), temperature source Rectal, resp. rate 10, SpO2 95.00%. Physical Exam  Constitutional: She is oriented to person, place, and time. She appears well-developed and well-nourished. No  distress.  HENT:  Head: Normocephalic and atraumatic.  Mouth/Throat: No oropharyngeal exudate.  Eyes: Conjunctivae and EOM are normal. Pupils are equal, round, and reactive to light. Right eye exhibits no discharge. Left eye exhibits no discharge. No scleral icterus.  Neck: Normal range of motion. Neck supple. No JVD present. No tracheal deviation present. No thyromegaly present.  Cardiovascular: Normal rate, regular rhythm and normal heart sounds.  Exam reveals no gallop and no friction rub.   No murmur heard. Respiratory: Effort normal and breath sounds normal. No stridor. No respiratory distress. She has no wheezes. She has no rales. She exhibits no tenderness.  GI: Soft. Bowel sounds are normal. She exhibits no distension and no mass. There is no tenderness. There is no rebound and no guarding.  Genitourinary: Guaiac negative stool. No vaginal discharge found.  Musculoskeletal: Normal range of motion. She exhibits no edema and no tenderness.       No cva tenderness  Lymphadenopathy:    She has no cervical adenopathy.  Neurological: She is alert and oriented to person, place, and time. She has normal reflexes. She displays normal reflexes. No cranial nerve deficit. She exhibits normal muscle tone. Coordination normal.  Skin: Skin is warm and dry. No rash noted. She is not diaphoretic. No erythema. No pallor.  Psychiatric: She has a normal mood and affect. Her behavior is normal. Judgment and thought content normal.     Assessment/Plan N/v: ? Secondary to tramadol vs uti, vs gastritis Uti: tx with avelox 400mg  iv qday Renal insufficiency: check cmp in am Anemia: check cbc in am Hypothyroidism: check tsh in am, continue levothyroxine Dementia: cont namenda, cont aricept   Dariusz Brase 12/01/2011, 2:03 AM

## 2011-12-01 NOTE — ED Notes (Signed)
Pt has large red raised areas to Lt forearm up to under axillary area with itching that was not present before IV Cipro administration. Dr  Selena Batten aware, Cipro discontinued, order for benadryl received.

## 2011-12-01 NOTE — ED Provider Notes (Addendum)
History     CSN: [id number]  Arrival date & time 11/30/11  2309   First MD Initiated Contact with Patient 11/30/11 2342      No chief complaint on file.   (Consider location/radiation/quality/duration/timing/severity/associated sxs/prior treatment) HPI 78 year old female presents to the emergency department via EMS from home with complaint of nausea and vomiting, abdominal pain. Family reports they were seen earlier today by their primary care Dr. Patient had been complaining of right shoulder pain since Saturday. Patient was started on Ultram. Patient has received 2 doses one at 3 PM and 1 at 7:30 PM. About 10 PM patient developed vomiting. She has not taken tramadol in the past. Patient with history of TIAs and dementia. She has had no diarrhea, no fevers, no cough, no unusual foods, no sick contacts. Patient complained of generalized abdominal pain to family. Family reports patient has seemed cold and clammy tonight. No complaint of chest pain, shortness of breath  Past Medical History  Diagnosis Date  . OP (osteoporosis)   . Adenomatous polyps   . Hearing loss   . Hyperlipidemia   . Urge urinary incontinence   . Hypothyroidism   . Cervical prolapse   . Renal insufficiency   . Dementia   . PONV (postoperative nausea and vomiting)   . Dementia   . Hypertension     Past Surgical History  Procedure Date  . Wrist fracture surgery 2008    No family history on file.  History  Substance Use Topics  . Smoking status: Never Smoker   . Smokeless tobacco: Never Used  . Alcohol Use: No    OB History    Grav Para Term Preterm Abortions TAB SAB Ect Mult Living                  Review of Systems  Unable to perform ROS: Dementia    Allergies  Dilaudid and Penicillins  Home Medications   Current Outpatient Rx  Name Route Sig Dispense Refill  . ASPIRIN 81 MG PO TABS Oral Take 81 mg by mouth daily.      . ATORVASTATIN CALCIUM 20 MG PO TABS Oral Take 20 mg by mouth  every evening.    Marland Kitchen CALCIUM CARBONATE-VITAMIN D 500-200 MG-UNIT PO TABS Oral Take 1 tablet by mouth 2 (two) times daily.      . CHLORTHALIDONE 25 MG PO TABS Oral Take 25 mg by mouth every morning.    . DONEPEZIL HCL 10 MG PO TABS Oral Take 10 mg by mouth at bedtime.      Marland Kitchen LEVOTHYROXINE SODIUM 100 MCG PO TABS Oral Take 100 mcg by mouth every morning.    Marland Kitchen MEMANTINE HCL 10 MG PO TABS Oral Take 10 mg by mouth 2 (two) times daily.      Marland Kitchen OMEPRAZOLE 20 MG PO CPDR Oral Take 20 mg by mouth every evening.    Marland Kitchen TRAMADOL HCL 50 MG PO TABS Oral Take 50 mg by mouth 3 (three) times daily as needed. For pain      BP 155/52  Pulse 54  Temp 94.3 F (34.6 C) (Rectal)  Resp 11  SpO2 95%  Physical Exam  Nursing note and vitals reviewed. Constitutional:       Pt with hypertension, mild bradycardia  HENT:  Head: Normocephalic.       Linear abrasion to Center forehead  Eyes: Conjunctivae and EOM are normal. Pupils are equal, round, and reactive to light.  Neck: Normal range of motion. Neck  supple. No JVD present. No tracheal deviation present. No thyromegaly present.  Cardiovascular: Normal rate, normal heart sounds and intact distal pulses.  Exam reveals no gallop and no friction rub.   No murmur heard. Pulmonary/Chest: Effort normal and breath sounds normal. No stridor. No respiratory distress. She has no wheezes. She has no rales. She exhibits no tenderness.  Abdominal: Soft. Bowel sounds are normal. She exhibits no distension and no mass. There is tenderness (diffuse tenderness across entire abdomen without rebound or guarding). There is no rebound and no guarding.  Musculoskeletal: Normal range of motion. She exhibits no edema and no tenderness.  Lymphadenopathy:    She has no cervical adenopathy.  Neurological:       Oriented to self, hospital  Skin: No rash noted. She is diaphoretic. No erythema. No pallor.       Cool to touch  Psychiatric: She has a normal mood and affect.    ED Course    Procedures (including critical care time)  Labs Reviewed  GLUCOSE, CAPILLARY - Abnormal; Notable for the following:    Glucose-Capillary 117 (*)     All other components within normal limits  CBC - Abnormal; Notable for the following:    WBC 11.7 (*)     RBC 3.68 (*)     Hemoglobin 11.1 (*)     HCT 34.0 (*)     All other components within normal limits  DIFFERENTIAL - Abnormal; Notable for the following:    Neutrophils Relative 79 (*)     Neutro Abs 9.3 (*)     All other components within normal limits  COMPREHENSIVE METABOLIC PANEL - Abnormal; Notable for the following:    Glucose, Bld 150 (*)     BUN 34 (*)     Creatinine, Ser 1.52 (*)     Albumin 3.3 (*)     Alkaline Phosphatase 21 (*)     GFR calc non Af Amer 29 (*)     GFR calc Af Amer 34 (*)     All other components within normal limits  URINALYSIS, ROUTINE W REFLEX MICROSCOPIC - Abnormal; Notable for the following:    APPearance CLOUDY (*)     Leukocytes, UA SMALL (*)     All other components within normal limits  URINE MICROSCOPIC-ADD ON - Abnormal; Notable for the following:    Bacteria, UA MANY (*)     All other components within normal limits  LIPASE, BLOOD  LACTIC ACID, PLASMA  URINE CULTURE   No results found.   Date: 11/30/2011  Rate: 58  Rhythm: sinus bradycardia  QRS Axis: normal  Intervals: PR prolonged and QT prolonged  ST/T Wave abnormalities: normal  Conduction Disutrbances:first-degree A-V block   Narrative Interpretation: 1st degree new  Old EKG Reviewed: changes noted    1. Urinary tract infection   2. Nausea and vomiting       MDM  78 year old female with nausea and vomiting at night. Patient recently started on tramadol which may be source of vomiting, but patient also found to have urinary tract infection. Will treat with IV Cipro given allergy to penicillin. Will discuss with hospitalist for admission given age and frailty        Olivia Mackie, MD 12/01/11 0150  Olivia Mackie, MD 12/01/11 [phone]

## 2011-12-01 NOTE — Progress Notes (Addendum)
Pt seen and examined, admitted this am by Dr.Kim 1. Nausea/Vomiting: induced by Tramadol, resolved 2. Possible UTI: UA not very convincing, change Meropenem to Ceftriaxone FU Urine Cx 3. Renal Insufficiency: possibly chronic 4. R shoulder pain: Rotator cuff injury vs tendinitis: tylenol PRN, No NSAIDs due to 3, Outpatient PT 5. Dementia: stable, continue Aricept/NAmenda 6. DVT Prophylaxis: lovenox  Dispo: home tomorrow if stable  Zannie Cove, MD [facility] [phone]  Addendum: notified by nurses about increasing R hip pain, unable to bear weight On exam: Painful internal and external rotation of R Hip Will get Xrays, if negative may need Pelvic CT  Zannie Cove, MD

## 2011-12-01 NOTE — Progress Notes (Signed)
78yo female to begin IV ABX for complicated UTI.  Will begin Merrem 500mg  IV Q12H for CrCl ~32ml/min.  Vernard Gambles, PharmD, BCPS 12/01/2011 2:39 AM

## 2011-12-01 NOTE — ED Notes (Signed)
Dr Otter at bedside  

## 2011-12-01 NOTE — Progress Notes (Signed)
Physical Therapy Evaluation Patient Details Name: [name] MRN: [id number] DOB: Jul 02, 1920 Today's Date: 12/01/2011 Time: 4098-1191 PT Time Calculation (min): 37 min  PT Assessment / Plan / Recommendation Clinical Impression  78 yo female admitted with N/V/UTI presents with decr functional mobility;   Will benefit from PT to maximize independence and safety with mobility, transfers, amb to return safely to prior level of function;   Home to familiar environment is often the most therapeutic place for pt's with dementia, and it sounds like pt has adequate asssit at home;   Will recommend HHPT follow-up    PT Assessment  Patient needs continued PT services    Follow Up Recommendations  Home health PT;Supervision/Assistance - 24 hour    Barriers to Discharge        lEquipment Recommendations  Tub/shower seat    Recommendations for Other Services OT consult   Frequency Min 3X/week    Precautions / Restrictions Precautions Precautions: Fall Restrictions Other Position/Activity Restrictions: Very painful Right shoulder   Pertinent Vitals/Pain Pain in R shoulder, R anterior hip/groin; did not rate, but pain significantly limited transfers; Repositioned and notified RN       Mobility  Bed Mobility Bed Mobility: Rolling Right;Right Sidelying to Sit;Sitting - Scoot to [place] of Bed Rolling Right: 4: Min assist;With rail Right Sidelying to Sit: 3: Mod assist;With rails Sitting - Scoot to Edge of Bed: 3: Mod assist Details for Bed Mobility Assistance: Max cues for initiation and technique; slow moving, limited by pain Transfers Transfers: Sit to Stand;Stand to Sit;Stand Pivot Transfers Sit to Stand: 3: Mod assist;From bed Stand to Sit: 3: Mod assist;To chair/3-in-1 Stand Pivot Transfers: 3: Mod assist Details for Transfer Assistance: Near constant step-by-step cues for transfers; pt very hesitant to move secondary to pain; attempted to amb, but pt only tolerating transfers  at this time Ambulation/Gait Ambulation/Gait Assistance: Other (comment) (Unable, limited by pain)    Exercises     PT Diagnosis: Difficulty walking;Acute pain;Generalized weakness  PT Problem List: Decreased strength;Decreased range of motion;Decreased activity tolerance;Decreased balance;Decreased mobility;Decreased cognition;Decreased knowledge of use of DME;Pain;Decreased safety awareness PT Treatment Interventions: DME instruction;Gait training;Functional mobility training;Therapeutic activities;Therapeutic exercise;Balance training;Patient/family education   PT Goals Acute Rehab PT Goals PT Goal Formulation: With family Time For Goal Achievement: 12/15/11 Potential to Achieve Goals: Fair Pt will go Supine/Side to Sit: with supervision PT Goal: Supine/Side to Sit - Progress: Goal set today Pt will go Sit to Supine/Side: with supervision PT Goal: Sit to Supine/Side - Progress: Goal set today Pt will go Sit to Stand: with min assist PT Goal: Sit to Stand - Progress: Goal set today Pt will go Stand to Sit: with min assist PT Goal: Stand to Sit - Progress: Goal set today Pt will Transfer Bed to Chair/Chair to Bed: with supervision PT Transfer Goal: Bed to Chair/Chair to Bed - Progress: Goal set today Pt will Ambulate: >150 feet;with min assist;with rolling walker PT Goal: Ambulate - Progress: Goal set today  Visit Information  Last PT Received On: 12/01/11 Assistance Needed: +1 (+2 may be helpful)    Subjective Data  Subjective: Reports is cold Patient Stated Goal: not stated   Prior Functioning  Home Living Lives With: Other (Comment) (with 24 hour CNA care) Available Help at Discharge: Personal care attendant;Available 24 hours/day Type of Home: House Home Access: Level entry Home Layout: One level Bathroom Shower/Tub: Walk-in shower Home Adaptive Equipment: Built-in shower seat;Walker - rolling Additional Comments: Pt is reluctant to use shower seat/  RW Prior  Function Level of Independence: Needs assistance Needs Assistance: Bathing;Dressing;Toileting;Meal Prep;Gait;Transfers Bath: Moderate Toileting: Moderate Meal Prep: Maximal Gait Assistance: min guard or handheld Transfer Assistance: min Comments: Per son, pt tends to pull up on her caregiver for sit to stand Communication Communication: No difficulties    Cognition  Overall Cognitive Status: History of cognitive impairments - at baseline (nto exactly sure of baseline) Arousal/Alertness: Awake/alert Orientation Level: Other (comment) (Not tested thoroughly 2/2 need to get to [facility]) Behavior During Session: [facility] for tasks performed    Extremity/Trunk Assessment Right Upper Extremity Assessment RUE ROM/Strength/Tone: Deficits;Due to pain RUE ROM/Strength/Tone Deficits: Pain with any movement Rshoulder Left Upper Extremity Assessment LUE ROM/Strength/Tone: WFL for tasks assessed Right Lower Extremity Assessment RLE ROM/Strength/Tone: Deficits RLE ROM/Strength/Tone Deficits: Painful moving RLE, especially into hip flexion; Pain also limiting WBing Left Lower Extremity Assessment LLE ROM/Strength/Tone: Deficits LLE ROM/Strength/Tone Deficits: Generally weak, dependening on assist for sit to stand Trunk Assessment Trunk Assessment: Kyphotic   Balance    End of Session PT - End of Session Equipment Utilized During Treatment: Gait belt Activity Tolerance: Patient limited by pain Patient left: in chair;with call bell/phone within reach;with family/visitor present Nurse Communication: Mobility status (Pin limiting mobility)   Olen Pel [place], [place] [phone]  12/01/2011, 4:22 PM

## 2011-12-02 ENCOUNTER — Inpatient Hospital Stay (HOSPITAL_COMMUNITY): Payer: Medicare Other

## 2011-12-02 DIAGNOSIS — N179 Acute kidney failure, unspecified: Secondary | ICD-10-CM

## 2011-12-02 DIAGNOSIS — E782 Mixed hyperlipidemia: Secondary | ICD-10-CM

## 2011-12-02 DIAGNOSIS — S32409A Unspecified fracture of unspecified acetabulum, initial encounter for closed fracture: Secondary | ICD-10-CM

## 2011-12-02 DIAGNOSIS — M25511 Pain in right shoulder: Secondary | ICD-10-CM | POA: Diagnosis present

## 2011-12-02 DIAGNOSIS — R112 Nausea with vomiting, unspecified: Secondary | ICD-10-CM

## 2011-12-02 DIAGNOSIS — S32401A Unspecified fracture of right acetabulum, initial encounter for closed fracture: Secondary | ICD-10-CM | POA: Diagnosis present

## 2011-12-02 LAB — BASIC METABOLIC PANEL
BUN: 32 mg/dL — ABNORMAL HIGH (ref 6–23)
Calcium: 9.1 mg/dL (ref 8.4–10.5)
GFR calc Af Amer: 32 mL/min — ABNORMAL LOW (ref >90)
GFR calc non Af Amer: 27 mL/min — ABNORMAL LOW (ref >90)
Glucose, Bld: 85 mg/dL (ref 70–99)

## 2011-12-02 LAB — CBC
HCT: 32 % — ABNORMAL LOW (ref 36.0–46.0)
Hemoglobin: 10.7 g/dL — ABNORMAL LOW (ref 12.0–15.0)
MCH: 30.9 pg (ref 26.0–34.0)
MCHC: 33.4 g/dL (ref 30.0–36.0)
RDW: 14.8 % (ref 11.5–15.5)

## 2011-12-02 MED ORDER — SODIUM CHLORIDE 0.9 % IV SOLN
INTRAVENOUS | Status: AC
  Administered 2011-12-02: 12:00:00 via INTRAVENOUS

## 2011-12-02 NOTE — Progress Notes (Signed)
Agree with above OT note;  Van Clines, PT [phone]

## 2011-12-02 NOTE — Progress Notes (Addendum)
[facility] PROGRESS NOTE  [name] ONG:[id number] DOB: 24-Jul-1920 DOA: 11/30/2011 PCP: Almedia Balls, MD  Assessment/Plan: 1. Nausea/vomiting: Resolved. Secondary to tramadol. 2. Possible UTI: Asymptomatic. Favor 3 day treatment. Allergic to penicillin and apparent reaction to ciprofloxacin this admission. Therefore started on meropenem and then changed to ceftriaxone. Followup urine culture. 3. Acute renal failure versus progression possible chronic kidney disease stage III: Slightly worse today. IV fluids. Repeat basic metabolic panel the morning. Not on any likely nephrotoxins. 4. Right shoulder pain: Resolved. Probably muscular strain related to assistance with transfers at home. Check x-rays.  5. Nondisplaced fracture right acetabulum: Origin unclear. Orthopedics consultation. 6. Anemia: Stable. Mild normocytic anemia. Monitor. Consider outpatient evaluation. 7. Hypothyroidism: TSH within normal limits. 8. Vaginal prolapse: Follow-up with gynecology as an outpatient. 9. Dementia: Stable.  Code Status: DNR per discussion with son at bedside Family Communication: Discussed with son at bedside Disposition Plan: Home when stable  Brendia Sacks, MD  [facility] Pager [phone]. If 8PM-8AM, please contact night-coverage at [email or web address], password [facility] 12/02/2011, 9:00 AM  LOS: 2 days   Brief narrative: 78 year old woman presented from home with complaint of nausea, vomiting, abdominal pain. Started on Ultram as an outpatient for right shoulder pain present several days prior to admission. Also reported to have slid off her bed onto the floor at home. Admitted for UTI and nausea and vomiting.  Consultants:  Physical therapy: Home health physical therapy, shower seat  Procedures:    Antibiotics:  Ceftriaxone 6/25?  HPI/Subjective: Afebrile, vital signs stable. No right shoulder pain. Complains of right hip pain.  Objective: Filed Vitals:   12/01/11  1414 12/01/11 1800 12/01/11 2137 12/02/11 0449  BP: 110/56 113/63 140/53 175/71  Pulse: 72 70 78 81  Temp: 98.2 F (36.8 C) 98.3 F (36.8 C) 97.7 F (36.5 C) 97.7 F (36.5 C)  TempSrc: Oral Oral Oral Oral  Resp: 18 18 18 18   Weight:   54.9 kg (121 lb 0.5 oz)   SpO2: 94% 97% 97% 99%    Intake/Output Summary (Last 24 hours) at 12/02/11 0900 Last data filed at 12/01/11 1700  Gross per 24 hour  Intake    240 ml  Output      3 ml  Net    237 ml    Exam:   General:  Appears calm and comfortable.  Cardiovascular: Regular rate and rhythm. No murmur, rub, gallop. No lower extremity edema.  Telemetry: Sinus rhythm. No significant arrhythmias.  Respiratory: Clear to auscultation bilaterally. No wheezes, rales, rhonchi. Normal respiratory effort.  Musculoskeletal: Right arm: Appears full range of movement without pain. Easily abduct shoulder. No pain with palpation of arm. Able to lift both legs off the bed although has significant pain when lifting right leg off the bed and hip area.  Data Reviewed: Basic Metabolic Panel:  Lab 12/02/11 2536 12/01/11 0625 12/01/11 0003  NA 141 139 143  K 3.6 3.9 3.5  CL 104 103 106  CO2 27 27 26   GLUCOSE 85 122* 150*  BUN 32* 33* 34*  CREATININE 1.61* 1.51* 1.52*  CALCIUM 9.1 9.3 9.3  MG -- -- --  PHOS -- -- --   Liver Function Tests:  Lab 12/01/11 0625 12/01/11 0003  AST 23 24  ALT 16 18  ALKPHOS 22* 21*  BILITOT 0.3 0.3  PROT 6.5 6.7  ALBUMIN 3.1* 3.3*    Lab 12/01/11 0003  LIPASE 34  AMYLASE --   CBC:  Lab 12/02/11 0605 12/01/11 0625 12/01/11  0003  WBC 7.1 9.5 11.7*  NEUTROABS -- 7.6 9.3*  HGB 10.7* 10.5* 11.1*  HCT 32.0* 32.6* 34.0*  MCV 92.5 91.8 92.4  PLT 219 218 214   CBG:  Lab 11/30/11 2331  GLUCAP 117*    Recent Results (from the past 240 hour(s))  URINE CULTURE     Status: Normal (Preliminary result)   Collection Time   12/01/11 12:33 AM      Component Value Range Status Comment   Specimen Description  URINE, CATHETERIZED   Final    Special Requests ADDED 0312   Final    Culture  Setup Time [id number]   Final    Colony Count PENDING   Incomplete    Culture Culture reincubated for better growth   Final    Report Status PENDING   Incomplete      Studies: Dg Hip Complete Right  12/01/2011  *RADIOLOGY REPORT*  Clinical Data: Post fall, now with right-sided hip pain  RIGHT HIP - COMPLETE 2+ VIEW  Comparison: None.  Findings: No fracture or dislocation.  Right hip joint spaces are preserved.  There is minimal enthesopathic change of the greater trochanter.  Limited visualization of the adjacent pelvis demonstrates degenerative change of the pubic symphysis. Degenerate change of the lumbar spine is suspected.  Regional soft tissues are normal.  IMPRESSION: No fracture.  Original Report Authenticated By: Waynard Reeds, M.D.   Ct Pelvis Wo Contrast  12/01/2011  *RADIOLOGY REPORT*  Clinical Data:  Right hip pain secondary to a fall.  CT PELVIS WITHOUT CONTRAST  Technique:  Multidetector CT imaging of the pelvis was performed following the standard protocol without intravenous contrast.  Comparison:  Radiographs dated 12/01/2011  Findings:  There is a fracture involving the roof of the right acetabulum extending to the medial wall of the acetabulum and to the base of the right superior pubic ramus.  The ramus is intact. Inferior pubic ramus is normal.  The other pelvic bones are intact.  Proximal femurs are intact.  Incidental note is made of extensive diverticulosis of the sigmoid portion of the colon.  There is vaginal prolapse.  IMPRESSION:  1.  Nondisplaced fracture of the superior and anterior medial aspects of the right acetabulum. 2.  Vaginal prolapse.  Original Report Authenticated By: Gwynn Burly, M.D.   Scheduled Meds:   . aspirin  81 mg Oral Daily  . atorvastatin  20 mg Oral QPM  . calcium-vitamin D  1 tablet Oral BID WC  . cefTRIAXone (ROCEPHIN)  IV  1 g Intravenous Q24H  .  chlorthalidone  25 mg Oral q morning - 10a  . conjugated estrogens  1 Applicatorful Vaginal Daily  . donepezil  10 mg Oral QHS  . enoxaparin (LOVENOX) injection  30 mg Subcutaneous Q24H  . levothyroxine  100 mcg Oral Q breakfast  . memantine  10 mg Oral BID  . ondansetron      . pantoprazole  40 mg Oral Q1200  . sodium chloride  500 mL Intravenous Once  . DISCONTD: levofloxacin (LEVAQUIN) IV  500 mg Intravenous Q24H  . DISCONTD: meropenem (MERREM) IV  500 mg Intravenous Once  . DISCONTD: meropenem (MERREM) IV  500 mg Intravenous Q12H   EKG 6/24: Sinus rhythm, prolonged QT. No acute changes.  Continuous Infusions:   Principal Problem:  *Closed right acetabular fracture Active Problems:  Acute renal failure  CKD (chronic kidney disease), stage III  Hypothyroidism  Dementia  Nausea and vomiting  UTI (lower  urinary tract infection)  Right shoulder pain

## 2011-12-02 NOTE — Progress Notes (Signed)
OT Cancellation Note  Treatment cancelled today due to medical issues with patient which prohibited therapy.    Pt with new dx of R acetabular fx.  Will await activity/weight bearing orders before continuing with therapy.    May be worth considering ortho consult.  Please consider imaging R shoulder.  Thanks,  Evern Bio 12/02/2011, 8:46 AM

## 2011-12-03 DIAGNOSIS — S32409A Unspecified fracture of unspecified acetabulum, initial encounter for closed fracture: Secondary | ICD-10-CM

## 2011-12-03 DIAGNOSIS — R112 Nausea with vomiting, unspecified: Secondary | ICD-10-CM

## 2011-12-03 DIAGNOSIS — N179 Acute kidney failure, unspecified: Secondary | ICD-10-CM

## 2011-12-03 DIAGNOSIS — E782 Mixed hyperlipidemia: Secondary | ICD-10-CM

## 2011-12-03 LAB — BASIC METABOLIC PANEL
BUN: 29 mg/dL — ABNORMAL HIGH (ref 6–23)
Calcium: 9.1 mg/dL (ref 8.4–10.5)
Creatinine, Ser: 1.48 mg/dL — ABNORMAL HIGH (ref 0.50–1.10)
GFR calc Af Amer: 35 mL/min — ABNORMAL LOW (ref >90)
GFR calc non Af Amer: 30 mL/min — ABNORMAL LOW (ref >90)

## 2011-12-03 LAB — URINE CULTURE: Culture  Setup Time: 201306250347

## 2011-12-03 MED ORDER — ENSURE COMPLETE PO LIQD
237.0000 mL | ORAL | Status: DC
  Administered 2011-12-03 – 2011-12-04 (×2): 237 mL via ORAL

## 2011-12-03 MED ORDER — SODIUM CHLORIDE 0.9 % IV SOLN
INTRAVENOUS | Status: AC
  Administered 2011-12-03 (×2): via INTRAVENOUS

## 2011-12-03 NOTE — Progress Notes (Signed)
OT  / PT Cancellation Note  Treatment cancelled today due to medical issues with patient which prohibited therapy. Pt currently without weight bearing status listed in order set by MD. OT / PT to hold treatment until after ortho consult. Spoke with RN and agreeable to hold treatment.   Harrel Carina [place]   OTR/L Pager: [phone] Office: [phone] .  12/03/2011, 1:54 PM

## 2011-12-03 NOTE — Progress Notes (Signed)
[facility] PROGRESS NOTE  Cassidey Barrales Horen RUE:[id number] DOB: [birth year] DOA: 11/30/2011 PCP: Almedia Balls, MD  Assessment/Plan: 1. Nausea/vomiting: Resolved. Secondary to tramadol. 2. UTI: Asymptomatic. Favor 3 day treatment. Allergic to penicillin and apparent reaction to ciprofloxacin this admission. Therefore started on meropenem and then changed to ceftriaxone. Followup urine culture. 3. Acute renal failure versus progression possible chronic kidney disease stage III: Improving. IV fluids. Repeat basic metabolic panel the morning. Not on any likely nephrotoxins. 4. Right shoulder pain: Resolved. Probably has rotator cuff tendinopathy. X-rays negative.  5. Nondisplaced fracture right acetabulum: Most likely related to the fall prior to admission. Orthopedics consultation placed yesterday--discussed with Dr. Lajoyce Corners this AM. 6. Anemia: Stable. Mild normocytic anemia. Monitor. Consider outpatient evaluation. 7. Hypothyroidism: TSH within normal limits. 8. Vaginal prolapse: Follow-up with gynecology as an outpatient. 9. Dementia: Stable.  Code Status: DNR per discussion with son at bedside Family Communication: Discussed with son at bedside Disposition Plan: Home when stable--SNF?  Brendia Sacks, MD  [facility] Pager [phone]. If 8PM-8AM, please contact night-coverage at [email or web address], password [facility] 12/03/2011, 11:21 AM  LOS: 3 days   Brief narrative: 78 year old woman presented from home with complaint of nausea, vomiting, abdominal pain. Started on Ultram as an outpatient for right shoulder pain present several days prior to admission. Also reported to have slid off her bed onto the floor at home. Admitted for UTI and nausea and vomiting.  Consultants:  Physical therapy: Home health physical therapy, shower seat  Procedures:    Antibiotics:  Ceftriaxone 6/25?  HPI/Subjective: Afebrile, vital signs stable. No right shoulder pain. Complains of right hip  pain.  Objective: Filed Vitals:   12/02/11 1400 12/02/11 1800 12/02/11 2123 12/03/11 0533  BP: 122/62 156/76 152/87 181/79  Pulse: 69 66 71 69  Temp: 98.4 F (36.9 C) 98 F (36.7 C) 97.6 F (36.4 C) 97.4 F (36.3 C)  TempSrc: Oral Oral Oral Oral  Resp: 18 20 18 18   Weight:   54.9 kg (121 lb 0.5 oz)   SpO2: 96% 98% 94% 98%    Intake/Output Summary (Last 24 hours) at 12/03/11 1121 Last data filed at 12/03/11 1100  Gross per 24 hour  Intake    290 ml  Output      0 ml  Net    290 ml    Exam:   General:  Appears calm and comfortable.  Cardiovascular: Regular rate and rhythm. No murmur, rub, gallop. No lower extremity edema.  Respiratory: Clear to auscultation bilaterally. No wheezes, rales, rhonchi. Normal respiratory effort.  Musculoskeletal: Right arm: Compared full range of movement without pain. Easily abducts shoulder. No pain with palpation of arm. Able to lift both legs off the bed although has significant pain when lifting right leg off the bed and hip area.  Data Reviewed: Basic Metabolic Panel:  Lab 12/03/11 6213 12/02/11 0605 12/01/11 0625 12/01/11 0003  NA 143 141 139 143  K 3.5 3.6 3.9 3.5  CL 107 104 103 106  CO2 25 27 27 26   GLUCOSE 81 85 122* 150*  BUN 29* 32* 33* 34*  CREATININE 1.48* 1.61* 1.51* 1.52*  CALCIUM 9.1 9.1 9.3 9.3  MG -- -- -- --  PHOS -- -- -- --   CBG:  Lab 11/30/11 2331  GLUCAP 117*    Recent Results (from the past 240 hour(s))  URINE CULTURE     Status: Normal (Preliminary result)   Collection Time   12/01/11 12:33 AM      Component  Value Range Status Comment   Specimen Description URINE, CATHETERIZED   Final    Special Requests ADDED 0312   Final    Culture  Setup Time [id number]   Final    Colony Count >=100,000 COLONIES/ML   Final    Culture GRAM NEGATIVE RODS   Final    Report Status PENDING   Incomplete      Studies: Dg Hip Complete Right  12/01/2011  *RADIOLOGY REPORT*  Clinical Data: Post fall, now with  right-sided hip pain  RIGHT HIP - COMPLETE 2+ VIEW  Comparison: None.  Findings: No fracture or dislocation.  Right hip joint spaces are preserved.  There is minimal enthesopathic change of the greater trochanter.  Limited visualization of the adjacent pelvis demonstrates degenerative change of the pubic symphysis. Degenerate change of the lumbar spine is suspected.  Regional soft tissues are normal.  IMPRESSION: No fracture.  Original Report Authenticated By: Waynard Reeds, M.D.   Ct Pelvis Wo Contrast  12/01/2011  *RADIOLOGY REPORT*  Clinical Data:  Right hip pain secondary to a fall.  CT PELVIS WITHOUT CONTRAST  Technique:  Multidetector CT imaging of the pelvis was performed following the standard protocol without intravenous contrast.  Comparison:  Radiographs dated 12/01/2011  Findings:  There is a fracture involving the roof of the right acetabulum extending to the medial wall of the acetabulum and to the base of the right superior pubic ramus.  The ramus is intact. Inferior pubic ramus is normal.  The other pelvic bones are intact.  Proximal femurs are intact.  Incidental note is made of extensive diverticulosis of the sigmoid portion of the colon.  There is vaginal prolapse.  IMPRESSION:  1.  Nondisplaced fracture of the superior and anterior medial aspects of the right acetabulum. 2.  Vaginal prolapse.  Original Report Authenticated By: Gwynn Burly, M.D.   Scheduled Meds:    . aspirin  81 mg Oral Daily  . atorvastatin  20 mg Oral QPM  . calcium-vitamin D  1 tablet Oral BID WC  . cefTRIAXone (ROCEPHIN)  IV  1 g Intravenous Q24H  . chlorthalidone  25 mg Oral q morning - 10a  . conjugated estrogens  1 Applicatorful Vaginal Daily  . donepezil  10 mg Oral QHS  . enoxaparin (LOVENOX) injection  30 mg Subcutaneous Q24H  . levothyroxine  100 mcg Oral Q breakfast  . memantine  10 mg Oral BID  . pantoprazole  40 mg Oral Q1200  . sodium chloride  500 mL Intravenous Once   EKG 6/24: Sinus  rhythm, prolonged QT. No acute changes.  Continuous Infusions:    . sodium chloride 75 mL/hr at 12/02/11 1158  . sodium chloride 75 mL/hr at 12/03/11 4132    Principal Problem:  *Closed right acetabular fracture Active Problems:  Acute renal failure  CKD (chronic kidney disease), stage III  Hypothyroidism  Dementia  Nausea and vomiting  UTI (lower urinary tract infection)  Right shoulder pain

## 2011-12-03 NOTE — Progress Notes (Signed)
[name], PT

## 2011-12-03 NOTE — Progress Notes (Signed)
INITIAL ADULT NUTRITION ASSESSMENT Date: 12/03/2011   Time: 12:23 PM  Reason for Assessment: Low Braden  ASSESSMENT: Female 78 y.o.  Dx: Closed right acetabular fracture  Hx:  Past Medical History  Diagnosis Date  . OP (osteoporosis)   . Adenomatous polyps   . Hearing loss   . Hyperlipidemia   . Urge urinary incontinence   . Hypothyroidism   . Cervical prolapse   . Renal insufficiency   . PONV (postoperative nausea and vomiting)   . Hypertension   . GERD (gastroesophageal reflux disease)   . TIA (transient ischemic attack)     "multiple"  . Dementia     "really can't remember anything"   Past Surgical History  Procedure Date  . Wrist fracture surgery 2008    S/P fall; ?right  . Cataract extraction w/ intraocular lens  implant, bilateral 1990's   Related Meds:     . aspirin  81 mg Oral Daily  . atorvastatin  20 mg Oral QPM  . calcium-vitamin D  1 tablet Oral BID WC  . cefTRIAXone (ROCEPHIN)  IV  1 g Intravenous Q24H  . chlorthalidone  25 mg Oral q morning - 10a  . conjugated estrogens  1 Applicatorful Vaginal Daily  . donepezil  10 mg Oral QHS  . enoxaparin (LOVENOX) injection  30 mg Subcutaneous Q24H  . levothyroxine  100 mcg Oral Q breakfast  . memantine  10 mg Oral BID  . pantoprazole  40 mg Oral Q1200  . sodium chloride  500 mL Intravenous Once   Ht:   5' 4.5" (163.8 cm)  Wt: 121 lb 0.5 oz (54.9 kg)  Ideal Wt:    55.7 kg  % Ideal Wt: 99%  Wt Readings from Last 15 Encounters:  12/02/11 121 lb 0.5 oz (54.9 kg)  09/10/11 142 lb (64.411 kg)  08/31/11 142 lb (64.411 kg)  08/28/11 145 lb 11.6 oz (66.1 kg)  08/07/11 144 lb 8 oz (65.545 kg)  01/22/11 142 lb 8 oz (64.638 kg)  Usual Wt: 135 - 140 lb % Usual Wt: 88%  BMI 20.3 - WNL  Food/Nutrition Related Hx: decreased intake per son  Labs:  CMP     Component Value Date/Time   NA 143 12/03/2011 0610   K 3.5 12/03/2011 0610   CL 107 12/03/2011 0610   CO2 25 12/03/2011 0610   GLUCOSE 81 12/03/2011 0610    BUN 29* 12/03/2011 0610   CREATININE 1.48* 12/03/2011 0610   CREATININE 1.21* 08/31/2011 1610   CALCIUM 9.1 12/03/2011 0610   PROT 6.5 12/01/2011 0625   ALBUMIN 3.1* 12/01/2011 0625   AST 23 12/01/2011 0625   ALT 16 12/01/2011 0625   ALKPHOS 22* 12/01/2011 0625   BILITOT 0.3 12/01/2011 0625   GFRNONAA 30* 12/03/2011 0610   GFRAA 35* 12/03/2011 0610   CBG (last 3)   Basename 11/30/11 2331  GLUCAP 117*     Intake/Output Summary (Last 24 hours) at 12/03/11 1223 Last data filed at 12/03/11 1100  Gross per 24 hour  Intake    290 ml  Output      0 ml  Net    290 ml   Diet Order: Cardiac  Supplements/Tube Feeding: none  IVF:     sodium chloride Last Rate: 75 mL/hr at 12/02/11 1158  sodium chloride Last Rate: 75 mL/hr at 12/03/11 0919    Estimated Nutritional Needs:   Kcal:  1200 - 1400 kcal Protein:  55 - 65 grams Fluid:  1.5 liters  daily  RD drawn to chart 2/2 Low Braden. Current Braden score is 12. Admitted with n/v, work-up reveals 2/2 tramadol. Pt with dementia. Per weight hx, pt with 12% wt loss x 2 months. (Son reports usual wt is 135 - 140lb, pt is currently 121 lb.) Discussed current wt with son, who knew pt was losing wt, but was surprised at current wt of 121 lb. This RD re-weighed pt in bed and weight was 119 lb.  Son reports that pt has 24-hour caregivers at home. Diet typically consists of Lean Cuisines and other easy-to-prepare meals. Son reports decline in intake but unable to quantify how much. Pt with dementia. Very inactive at baseline per son. Denies chewing or swallowing difficulties.   Per son, pt's favorite meal is chicken nuggets and honey mustard; does not follow any diet restrictions at home. This RD asked Dr. Irene Limbo for diet liberalization and mentioned recent 20 lb wt loss.  Pt meets criteria for moderate malnutrition in the context of chronic illness 2/2 12% wt loss x 2 months, likely muscle mass loss given recent inactive lifestyle and moderate temporal  wasting, and likely <75% of estimated energy intake for at least 1 month.  NUTRITION DIAGNOSIS: Unintentional wt loss r/t dementia and advanced age AEB 12% wt loss x 2 months.  MONITORING/EVALUATION(Goals): Goal: Pt to consume at least 50% of meals. Monitor: weights, labs, PO intake, I/O's  EDUCATION NEEDS: -No education needs identified  INTERVENTION: 1. Liberalize diet to Regular 2. Add Chocolate Ensure daily, each Ensure provides 350 kcal and 13 grams protein 3. RD to continue to follow nutrition care plan  Dietitian #: [phone] 11/23/2644  DOCUMENTATION CODES Per approved criteria  -Non-severe (moderate) malnutrition in the context of chronic illness    Adair Laundry 12/03/2011, 12:23 PM

## 2011-12-04 DIAGNOSIS — S32409A Unspecified fracture of unspecified acetabulum, initial encounter for closed fracture: Secondary | ICD-10-CM

## 2011-12-04 DIAGNOSIS — E782 Mixed hyperlipidemia: Secondary | ICD-10-CM

## 2011-12-04 DIAGNOSIS — N179 Acute kidney failure, unspecified: Secondary | ICD-10-CM

## 2011-12-04 DIAGNOSIS — M7580 Other shoulder lesions, unspecified shoulder: Secondary | ICD-10-CM | POA: Diagnosis present

## 2011-12-04 DIAGNOSIS — R112 Nausea with vomiting, unspecified: Secondary | ICD-10-CM

## 2011-12-04 LAB — BASIC METABOLIC PANEL
Calcium: 8.6 mg/dL (ref 8.4–10.5)
Creatinine, Ser: 1.38 mg/dL — ABNORMAL HIGH (ref 0.50–1.10)
GFR calc Af Amer: 38 mL/min — ABNORMAL LOW (ref >90)
GFR calc non Af Amer: 33 mL/min — ABNORMAL LOW (ref >90)
Sodium: 141 mEq/L (ref 135–145)

## 2011-12-04 MED ORDER — ENSURE COMPLETE PO LIQD: 237.0000 mL | ORAL | Status: DC

## 2011-12-04 MED ORDER — ACETAMINOPHEN 325 MG PO TABS: 650.0000 mg | ORAL_TABLET | Freq: Four times a day (QID) | ORAL | Status: DC | PRN

## 2011-12-04 NOTE — Clinical Social Work Psychosocial (Signed)
Clinical Social Work Department BRIEF PSYCHOSOCIAL ASSESSMENT 12/04/2011  Patient:  [name], [name]     Account Number:  [id number]     Admit date:  11/30/2011  Clinical Social Worker:  Delmer Islam  Date/Time:  12/04/2011 11:40 AM  Referred by:  Physician  Date Referred:  12/04/2011 Referred for  SNF Placement   Other Referral:   CSW advised by physical therapist that family had decided that it would be better for patient to go to a facility for ST rehab rather than going home.   Interview type:  Family Other interview type:   CSW also talked with patient who is alert and pleasant    PSYCHOSOCIAL DATA Living Status:  ALONE Admitted from facility:   Level of care:   Primary support name:  Sons Primary support relationship to patient:  CHILD, ADULT Degree of support available:   Patient has 2 sons and 2 daughters. Simmie Davies [place] [phone]) and Marry Guan' Theriault [phone] were in the room.  **Patient has has 24/7 assistance at home. CNA Lacey Jensen was in the room and works M-F, 8 am to 4 pm. There are other CNA's who work other shifts.    CURRENT CONCERNS Current Concerns  Post-Acute Placement   Other Concerns:    SOCIAL WORK ASSESSMENT / PLAN CSW talked with sons and patient regarding dishcarge plans. Everyone is in agreement with short-term rehab and patient is looking forward to completing her rehab within a couple of weeks and then going to the beach with her family. The son's gave their facility preferences as [name].   Assessment/plan status:  Psychosocial Support/Ongoing Assessment of Needs Other assessment/ plan:   Information/referral to community resources:   Sons given SNF list    PATIENT'S/FAMILY'S RESPONSE TO PLAN OF CARE: The family and patient had already decided on SNF placement and were looking forward to speaking with this CSW. Son's will remain at the bedside today.

## 2011-12-04 NOTE — Progress Notes (Signed)
Physical Therapy Treatment Patient Details Name: [name] MRN: [id number] DOB: 10-09-1920 Today's Date: 12/04/2011 Time: 4098-1191 PT Time Calculation (min): 15 min  PT Assessment / Plan / Recommendation Comments on Treatment Session  Pt with difficuly understanding TDWB, so emphasized simply keepign weight off of RLW by pushing into RW (also, Ortho progress note states WBAT); Very motivated to get to beach, and this is possible with a few weeks at SNF for rehab; Pt and family in agreement for SNF, notified SW    Follow Up Recommendations  Skilled nursing facility    Barriers to Discharge        Equipment Recommendations  Defer to next venue    Recommendations for Other Services    Frequency Min 3X/week   Plan Discharge plan needs to be updated    Precautions / Restrictions Precautions Precautions: Fall Restrictions Weight Bearing Restrictions: Yes RLE Weight Bearing: Touchdown weight bearing Other Position/Activity Restrictions: Very painful Right shoulder   Pertinent Vitals/Pain Pain with WBing RLE; no severity given; pain subsides with sitting     Mobility  Transfers Transfers: Sit to Stand;Stand to Sit Sit to Stand: 3: Mod assist;With armrests;From chair/3-in-1 Stand to Sit: 3: Mod assist;With armrests;To chair/3-in-1 Details for Transfer Assistance: Cues for postitioning for comfort with transfers, also for safety, and hand placement Ambulation/Gait Ambulation/Gait Assistance: 1: +2 Total assist Ambulation/Gait: Patient Percentage: 60% Ambulation Distance (Feet): 8 Feet Assistive device: Rolling walker Ambulation/Gait Assistance Details: Cues for gait sequence, and to unweigh painful RLE during LLE stepping by pushing down into RW Gait Pattern: Step-to pattern    Exercises     PT Diagnosis:    PT Problem List:   PT Treatment Interventions:     PT Goals Acute Rehab PT Goals Time For Goal Achievement: 12/15/11 Potential to Achieve Goals: Good Pt will go  Sit to Stand: with min assist PT Goal: Sit to Stand - Progress: Progressing toward goal Pt will go Stand to Sit: with min assist PT Goal: Stand to Sit - Progress: Progressing toward goal Pt will Transfer Bed to Chair/Chair to Bed: with supervision PT Transfer Goal: Bed to Chair/Chair to Bed - Progress: Progressing toward goal Pt will Ambulate: >150 feet;with min assist;with rolling walker PT Goal: Ambulate - Progress: Progressing toward goal  Visit Information  Last PT Received On: 12/04/11 Assistance Needed: +2 (helpful)    Subjective Data  Subjective: REALLY wants to get to the beach in approx 3 weeks Patient Stated Goal: the beach   Cognition  Overall Cognitive Status: History of cognitive impairments - at baseline Arousal/Alertness: Awake/alert Orientation Level: Appears intact for tasks assessed Behavior During Session: [facility] for tasks performed    Balance     End of Session PT - End of Session Equipment Utilized During Treatment: Gait belt Activity Tolerance: Patient limited by pain Patient left: in chair;with call bell/phone within reach;with family/visitor present Nurse Communication: Mobility status   GP     Olen Pel [place], [place] [phone]  12/04/2011, 11:14 AM

## 2011-12-04 NOTE — Clinical Social Work Placement (Addendum)
Clinical Social Work Department CLINICAL SOCIAL WORK PLACEMENT NOTE 12/04/2011  Patient:  [name], [name]  Account Number:  [id number] Admit date:  11/30/2011  Clinical Social Worker:  Genelle Bal, LCSW  Date/time:  12/04/2011 11:58 AM  Clinical Social Work is seeking post-discharge placement for this patient at the following level of care:   SKILLED NURSING   (*CSW will update this form in Epic as items are completed)   12/04/2011  Patient/family provided with [facility] Department of Clinical Social Work's list of facilities offering this level of care within the geographic area requested by the patient (or if unable, by the patient's family).  12/04/2011  Patient/family informed of their freedom to choose among providers that offer the needed level of care, that participate in Medicare, Medicaid or managed care program needed by the patient, have an available bed and are willing to accept the patient.    Patient/family informed of [facility]' ownership interest in [facility], as well as of the fact that they are under no obligation to receive care at this facility.  PASARR submitted to EDS on 12/04/2011 PASARR number received from EDS on 12/04/11  FL2 transmitted to all facilities in geographic area requested by pt/family on  12/04/2011 FL2 transmitted to all facilities within larger geographic area on   Patient informed that his/her managed care company has contracts with or will negotiate with  certain facilities, including the following:     Patient/family informed of bed offers received: 12/04/11  Patient chooses bed at [facility] Physician recommends and patient chooses bed at    Patient to be transferred to [facility] on 12/04/11   Patient to be transferred to facility by ambulance  The following physician request were entered in Epic:   Additional Comments: 12/04/11 - Preferences are [name] and [name].

## 2011-12-04 NOTE — Progress Notes (Signed)
Pt to be discharged to [facility]. I called and gave report to receiving nurse. Pt's IV has been removed. Discharge info packet to go with transport. Assessment unchanged from morning.

## 2011-12-04 NOTE — Discharge Instructions (Signed)
Progressive ambulation weightbearing as tolerated on the right.

## 2011-12-04 NOTE — Evaluation (Signed)
Occupational Therapy Evaluation Patient Details Name: [name] MRN: [id number] DOB: 12-29-1920 Today's Date: 12/04/2011 Time: 4098-1191 OT Time Calculation (min): 38 min  OT Assessment / Plan / Recommendation Clinical Impression  Pt admitted for fall with resulting acetabular fx on the R as well as R shoulder pain with deficits listed below.  Pt would benefit from cont OT to increase I with adls back to her baseline so she can hopefully go to the beach in July.    OT Assessment  Patient needs continued OT Services    Follow Up Recommendations  Skilled nursing facility    Barriers to Discharge None Has 24 hour aid.  Would still benfit from SNF rehab before going home with 1 person assist.  Equipment Recommendations  Defer to next venue    Recommendations for Other Services    Frequency  Min 2X/week    Precautions / Restrictions Precautions Precautions: Fall Required Braces or Orthoses: Other Brace/Splint Other Brace/Splint: R ankle support  Restrictions Weight Bearing Restrictions: Yes RLE Weight Bearing: Touchdown weight bearing Other Position/Activity Restrictions: Very painful Right shoulder   Pertinent Vitals/Pain Pt with pain of 4/10 with movement in R hip.    ADL  Eating/Feeding: Simulated;Set up Where Assessed - Eating/Feeding: Chair Grooming: Brushing hair;Performed;Wash/dry face;Set up;Other (comment) (occasional cues due to dementia) Where Assessed - Grooming: Supported sitting Upper Body Bathing: Simulated;Minimal assistance Where Assessed - Upper Body Bathing: Supported sitting Lower Body Bathing: Simulated;Moderate assistance Where Assessed - Lower Body Bathing: Supported sit to stand Upper Body Dressing: Simulated;Minimal assistance Where Assessed - Upper Body Dressing: Supported sitting Lower Body Dressing: Maximal assistance;Simulated Where Assessed - Lower Body Dressing: Supported sit to Pharmacist, hospital: Performed;Moderate assistance Toilet  Transfer Method: Stand pivot Acupuncturist: Bedside commode Toileting - Clothing Manipulation and Hygiene: Simulated;Maximal assistance Where Assessed - Engineer, mining and Hygiene: Sit to stand from 3-in-1 or toilet Equipment Used: Rolling walker Transfers/Ambulation Related to ADLs: Pt with great difficulty maintaining TDW status on the Right. ADL Comments: Pt had some assist prior to fall but was doing appx 75% of own adls with S.    OT Diagnosis: Generalized weakness;Acute pain  OT Problem List: Decreased strength;Decreased activity tolerance;Impaired balance (sitting and/or standing);Decreased cognition;Decreased knowledge of use of DME or AE;Decreased knowledge of precautions;Impaired UE functional use;Pain OT Treatment Interventions: Self-care/ADL training;DME and/or AE instruction;Therapeutic activities   OT Goals Acute Rehab OT Goals OT Goal Formulation: With patient/family Time For Goal Achievement: 12/18/11 Potential to Achieve Goals: Good ADL Goals Pt Will Perform Grooming: with supervision;Standing at sink ADL Goal: Grooming - Progress: Goal set today Pt Will Perform Lower Body Bathing: with min assist;Sit to stand from chair;Sitting at sink;Standing at sink ADL Goal: Lower Body Bathing - Progress: Goal set today Pt Will Perform Lower Body Dressing: with min assist;Sit to stand from chair;with adaptive equipment ADL Goal: Lower Body Dressing - Progress: Goal set today Pt Will Perform Tub/Shower Transfer: Shower transfer;with min assist ADL Goal: Tub/Shower Transfer - Progress: Goal set today Additional ADL Goal #1: Pt will complete all aspects of toileting on BSC over commode and min assist. ADL Goal: Additional Goal #1 - Progress: Goal set today  Visit Information  Last OT Received On: 12/04/11 Assistance Needed: +1    Subjective Data  Subjective: It hurts when I move. Patient Stated Goal: To go to the beach on July 20th   Prior  Functioning  Home Living Lives With: Other (Comment) (24 hour care from CNA) Available Help at  Discharge: Personal care attendant;Available 24 hours/day Type of Home: House Home Access: Level entry Home Layout: One level Bathroom Shower/Tub: Health visitor: Standard Home Adaptive Equipment: Built-in shower seat;Walker - rolling Additional Comments: Pt is reluctant to use shower seat/ RW Prior Function Level of Independence: Needs assistance Needs Assistance: Bathing;Dressing;Toileting;Meal Prep;Light Housekeeping Bath: Moderate Dressing: Minimal Toileting: Minimal Meal Prep: Maximal Light Housekeeping: Total Gait Assistance: min guard or handheld Transfer Assistance: min Driving: No Vocation: Retired Musician: No difficulties Dominant Hand: Right    Cognition  Overall Cognitive Status: History of cognitive impairments - at baseline Arousal/Alertness: Awake/alert Orientation Level: Appears intact for tasks assessed Behavior During Session: [facility] for tasks performed Cognition - Other Comments: Pt with baseline dementia.  Poor STM, poor attn span, poor problem solving.    Extremity/Trunk Assessment Right Upper Extremity Assessment RUE ROM/Strength/Tone: Deficits;Due to pain RUE ROM/Strength/Tone Deficits: Pain with any movement Rshoulder RUE Sensation: WFL - Light Touch RUE Coordination: WFL - gross/fine motor Left Upper Extremity Assessment LUE ROM/Strength/Tone: WFL for tasks assessed LUE Sensation: WFL - Light Touch LUE Coordination: WFL - gross/fine motor   Mobility Transfers Transfers: Sit to Stand;Stand to Sit Sit to Stand: 3: Mod assist;With armrests;From chair/3-in-1 Stand to Sit: 3: Mod assist;With armrests;To chair/3-in-1 Details for Transfer Assistance: Cues for postitioning for comfort with transfers, also for safety, and hand placement   Exercise    Balance Balance Balance Assessed: No  End of Session OT - End of  Session Activity Tolerance: Patient tolerated treatment well Patient left: in chair;with call bell/phone within reach;with family/visitor present Nurse Communication: Mobility status  GO     Hope Budds 12/04/2011, 11:55 AM [phone]

## 2011-12-04 NOTE — Progress Notes (Signed)
[facility] PROGRESS NOTE  Samantha Ragen Mannis RUE:[id number] DOB: Sep 10, 1920 DOA: 11/30/2011 PCP: Almedia Balls, MD Orthopedic surgeon: Aldean Baker, M.D.  Assessment/Plan: 1. Nausea/vomiting: Resolved. Secondary to tramadol. 2. UTI: Klebsiella pneumoniae--pansensitive. Asymptomatic. Treated. Allergic to penicillin and apparent reaction to ciprofloxacin this admission. Tolerated ceftriaxone.  3. Acute renal failure: Improving with IV fluids. Probably related to nausea and vomiting. ot on any likely nephrotoxins. 4. Right shoulder pain: Probably has rotator cuff tendinopathy. X-rays negative.  Physical therapy as an outpatient. 5. Nondisplaced fracture right acetabulum: Most likely related to the fall prior to admission. May begin progressive weightbearing as tolerated on the right. Followup in office with Dr. Lajoyce Corners in 2 weeks. 6. Anemia: Stable. Mild normocytic anemia. Monitor. Consider outpatient evaluation. 7. Hypothyroidism: TSH within normal limits. 8. Vaginal prolapse: Follow-up with gynecology as an outpatient. 9. Dementia: Stable.  Code Status: DNR Family Communication: Discussed with sons at bedside Disposition Plan: SNF today  Brendia Sacks, MD  [facility] Pager [phone]. If 8PM-8AM, please contact night-coverage at [email or web address], password [facility] 12/04/2011, 12:15 PM  LOS: 4 days   Brief narrative: 78 year old woman presented from home with complaint of nausea, vomiting, abdominal pain. Started on Ultram as an outpatient for right shoulder pain present several days prior to admission. Also reported to have slid off her bed onto the floor at home. Admitted for UTI and nausea and vomiting.  Consultants:  Orthopedics  Physical therapy: Skilled nursing facility  Occupational therapy: Skilled nursing facility  Procedures:  None  Antibiotics:  Ceftriaxone 6/25?6/28  HPI/Subjective: Afebrile, vital signs stable.  Minimalright shoulder pain. Complains of right  hip pain.  Objective: Filed Vitals:   12/03/11 1800 12/03/11 2026 12/04/11 0550 12/04/11 1000  BP: 136/68 162/75 156/84 155/76  Pulse: 86 72 83 82  Temp: 98 F (36.7 C) 98.2 F (36.8 C) 98.5 F (36.9 C) 97.3 F (36.3 C)  TempSrc: Oral Oral Oral Oral  Resp: 18 17 17 18   Weight:  53.797 kg (118 lb 9.6 oz)    SpO2: 98% 94% 97% 98%    Intake/Output Summary (Last 24 hours) at 12/04/11 1215 Last data filed at 12/03/11 1700  Gross per 24 hour  Intake    480 ml  Output      0 ml  Net    480 ml    Exam:   General:  Appears calm and comfortable.  Cardiovascular: Regular rate and rhythm. No murmur, rub, gallop. No lower extremity edema.  Respiratory: Clear to auscultation bilaterally. No wheezes, rales, rhonchi. Normal respiratory effort.  Data Reviewed: Basic Metabolic Panel:  Lab 12/04/11 6213 12/03/11 0610 12/02/11 0605 12/01/11 0625 12/01/11 0003  NA 141 143 141 139 143  K 3.5 3.5 3.6 3.9 3.5  CL 107 107 104 103 106  CO2 24 25 27 27 26   GLUCOSE 85 81 85 122* 150*  BUN 26* 29* 32* 33* 34*  CREATININE 1.38* 1.48* 1.61* 1.51* 1.52*  CALCIUM 8.6 9.1 9.1 9.3 9.3  MG -- -- -- -- --  PHOS -- -- -- -- --   CBG:  Lab 11/30/11 2331  GLUCAP 117*    Recent Results (from the past 240 hour(s))  URINE CULTURE     Status: Normal   Collection Time   12/01/11 12:33 AM      Component Value Range Status Comment   Specimen Description URINE, CATHETERIZED   Final    Special Requests ADDED 0312   Final    Culture  Setup Time [id number]  Final    Colony Count >=100,000 COLONIES/ML   Final    Culture     Final    Value: KLEBSIELLA PNEUMONIAE     Note: Two isolates with different morphologies were identified as the same organism.The most resistant organism was reported.   Report Status 12/03/2011 FINAL   Final    Organism ID, Bacteria KLEBSIELLA PNEUMONIAE   Final     Studies: Dg Hip Complete Right  12/01/2011  *RADIOLOGY REPORT*  Clinical Data: Post fall, now with  right-sided hip pain  RIGHT HIP - COMPLETE 2+ VIEW  Comparison: None.  Findings: No fracture or dislocation.  Right hip joint spaces are preserved.  There is minimal enthesopathic change of the greater trochanter.  Limited visualization of the adjacent pelvis demonstrates degenerative change of the pubic symphysis. Degenerate change of the lumbar spine is suspected.  Regional soft tissues are normal.  IMPRESSION: No fracture.  Original Report Authenticated By: Waynard Reeds, M.D.   Ct Pelvis Wo Contrast  12/01/2011  *RADIOLOGY REPORT*  Clinical Data:  Right hip pain secondary to a fall.  CT PELVIS WITHOUT CONTRAST  Technique:  Multidetector CT imaging of the pelvis was performed following the standard protocol without intravenous contrast.  Comparison:  Radiographs dated 12/01/2011  Findings:  There is a fracture involving the roof of the right acetabulum extending to the medial wall of the acetabulum and to the base of the right superior pubic ramus.  The ramus is intact. Inferior pubic ramus is normal.  The other pelvic bones are intact.  Proximal femurs are intact.  Incidental note is made of extensive diverticulosis of the sigmoid portion of the colon.  There is vaginal prolapse.  IMPRESSION:  1.  Nondisplaced fracture of the superior and anterior medial aspects of the right acetabulum. 2.  Vaginal prolapse.  Original Report Authenticated By: Gwynn Burly, M.D.   Scheduled Meds:    . aspirin  81 mg Oral Daily  . atorvastatin  20 mg Oral QPM  . calcium-vitamin D  1 tablet Oral BID WC  . cefTRIAXone (ROCEPHIN)  IV  1 g Intravenous Q24H  . conjugated estrogens  1 Applicatorful Vaginal Daily  . donepezil  10 mg Oral QHS  . enoxaparin (LOVENOX) injection  30 mg Subcutaneous Q24H  . feeding supplement  237 mL Oral Q24H  . levothyroxine  100 mcg Oral Q breakfast  . memantine  10 mg Oral BID  . pantoprazole  40 mg Oral Q1200  . DISCONTD: chlorthalidone  25 mg Oral q morning - 10a   EKG 6/24:  Sinus rhythm, prolonged QT. No acute changes.  Continuous Infusions:    . sodium chloride 75 mL/hr at 12/03/11 2204    Principal Problem:  *Closed right acetabular fracture Active Problems:  Acute renal failure  CKD (chronic kidney disease), stage III  Hypothyroidism  Dementia  Nausea and vomiting  UTI (lower urinary tract infection)  Right shoulder pain  Rotator cuff tendinitis

## 2011-12-04 NOTE — Clinical Social Work Note (Signed)
Patient medically stable for discharge today and chose [facility] for her short-term rehab. Discharge information forwarded to facility. CSW facilitated transport to SNF via ambulance.  Genelle Bal, MSW, LCSW [phone]

## 2011-12-04 NOTE — Consult Note (Signed)
Reason for Consult: Right acetabular fracture Referring Physician: Dr. Lajoyce Lauber [name] is an 78 y.o. female.  HPI: Patient is an 78 year old woman who fell sustaining a right nondisplaced acetabular fracture. Patient states she lives at home alone. She does have family that lives in town.  Past Medical History  Diagnosis Date  . OP (osteoporosis)   . Adenomatous polyps   . Hearing loss   . Hyperlipidemia   . Urge urinary incontinence   . Hypothyroidism   . Cervical prolapse   . Renal insufficiency   . PONV (postoperative nausea and vomiting)   . Hypertension   . GERD (gastroesophageal reflux disease)   . TIA (transient ischemic attack)     "multiple"  . Dementia     "really can't remember anything"    Past Surgical History  Procedure Date  . Wrist fracture surgery 2008    S/P fall; ?right  . Cataract extraction w/ intraocular lens  implant, bilateral 1990's    Family History  Problem Relation Age of Onset  . Osteoporosis Mother   . Dementia Father     Social History:  reports that she quit smoking about 69 years ago. Her smoking use included Cigarettes. She has a .5 pack-year smoking history. She has never used smokeless tobacco. She reports that she drinks alcohol. She reports that she does not use illicit drugs.  Allergies:  Allergies  Allergen Reactions  . Ciprocin-Fluocin-Procin (Fluocinolone) Rash    "got it 11/30/11; had to stop infusion and give her Benadryl"  . Dilaudid (Hydromorphone Hcl) Palpitations    "don't know the severity"  . Penicillins Rash    "don't know the severity"    Medications: I have reviewed the patient's current medications.  Results for orders placed during the hospital encounter of 11/30/11 (from the past 48 hour(s))  BASIC METABOLIC PANEL     Status: Abnormal   Collection Time   12/03/11  6:10 AM      Component Value Range Comment   Sodium 143  135 - 145 mEq/L    Potassium 3.5  3.5 - 5.1 mEq/L    Chloride 107  96 - 112  mEq/L    CO2 25  19 - 32 mEq/L    Glucose, Bld 81  70 - 99 mg/dL    BUN 29 (*) 6 - 23 mg/dL    Creatinine, Ser 9.62 (*) 0.50 - 1.10 mg/dL    Calcium 9.1  8.4 - 95.2 mg/dL    GFR calc non Af Amer 30 (*) >90 mL/min    GFR calc Af Amer 35 (*) >90 mL/min     Dg Shoulder Right  12/02/2011  *RADIOLOGY REPORT*  Clinical Data: Shoulder pain.  RIGHT SHOULDER - 2+ VIEW  Comparison: None.  Findings: Three-view exam shows no evidence for an acute fracture. There is no subluxation or dislocation.  Mild degenerative changes are seen in the glenoid.  There is a prominent spur from the undersurface of the acromion versus large intra-articular loose body.  IMPRESSION: No acute bony findings.  Large spur from the undersurface of the acromion may well be causing rotator cuff impingement.  Shoulder MRI would likely prove helpful to further evaluate as clinically warranted.  Original Report Authenticated By: ERIC A. MANSELL, M.D.    Review of Systems  All other systems reviewed and are negative.   Blood pressure 156/84, pulse 83, temperature 98.5 F (36.9 C), temperature source Oral, resp. rate 17, weight 53.797 kg (118 lb 9.6 oz),  SpO2 97.00%. Physical Exam On examination patient's bilateral lower extremities are neurovascularly intact. She has pain with range of motion of the right hip. Radiograph and CT scan shows a nondisplaced right acetabular fracture. Assessment/Plan: Assessment: Nondisplaced right acetabular fracture.  Plan: Patient may begin progressive ambulation weightbearing as tolerated on the right. Patient made discharged with family if she has 24 hours a day care. She would need home health physical therapy with discharge to home. Otherwise patient would require short-term skilled nursing for discharge for progressive ambulation until she is safe to discharge to home on her own. I will followup in the office in 2 weeks.  [name] V 12/04/2011, 6:33 AM

## 2011-12-04 NOTE — Progress Notes (Signed)
   CARE MANAGEMENT NOTE 12/04/2011  Patient:  [name], [name]   Account Number:  [id number]  Date Initiated:  12/04/2011  Documentation initiated by:  Jacki Couse  Subjective/Objective Assessment:   Pt for d/c to SNF     Action/Plan:   Early order for [facility], however pt will d/c to short term SNF for rehab.   Anticipated DC Date:  12/04/2011   Anticipated DC Plan:  SKILLED NURSING FACILITY         Choice offered to / List presented to:             Status of service:  Completed, signed off Medicare Important Message given?   (If response is "NO", the following Medicare IM given date fields will be blank) Date Medicare IM given:   Date Additional Medicare IM given:    Discharge Disposition: Pt to d/c to SNF for short term rehab.   Per UR Regulation:    If discussed at Long Length of Stay Meetings, dates discussed:    Comments:  06/

## 2011-12-04 NOTE — Discharge Summary (Signed)
Physician Discharge Summary  [name] ZOX:[id number] DOB: 1921-04-13 DOA: 11/30/2011  PCP: Laura Balls, MD Orthopedic surgeon: [name], M.D.  Admit date: 11/30/2011 Discharge date: 12/04/2011  Recommendations for Outpatient Follow-up:  1. Consider repeat basic metabolic panel one week to follow kidney function. 2. Note that hydrochlorothiazide discontinued secondary to renal failure. Monitor blood pressure. May need to institute new antihypertensive.  3. Physical and occupational therapy at skilled nursing facility for close acetabular fracture. 4. Recommend physical therapy for presumed right rotator cuff tendinopathy. 5. Recommend followup with gynecologist as an outpatient for pessary care.  Follow-up Information    Follow up with Laura V, MD in 2 weeks.   Contact information:   [address] [place] [address] [phone]       Follow up with Laura Balls, MD in 4 weeks.   Contact information:   [address] [place] [address] [phone]         Discharge Diagnoses:  1. Nausea, vomiting secondary to Ultram 2. Klebsiella pneumoniae UTI, treated 3. Acute renal failure 4. Nondisplaced fracture right acetabulum 5. Presumed right rotator cuff tendinopathy 6. Anemia, stable 7. Hypothyroidism  Discharge Condition: Improved Disposition: Skilled nursing facility  Diet recommendation: Regular  History of present illness:  78 year old woman presented from home with complaint of nausea, vomiting, abdominal pain. Started on Ultram as an outpatient for right shoulder pain present several days prior to admission. Also reported to have slid off her bed onto the floor at home. Admitted for UTI and nausea and vomiting.  Hospital Course:  [name] was admitted to medical floor. Nausea and vomiting resolved with supportive care. Urinary tract infection was treated with antibiotics. Acute renal failure was probably secondary to  nausea and vomiting as well as diuretics. Renal function is improved with holding of diuretic and with administration of IV fluids. Patient was seen by orthopedics for right acetabular fracture and progressive weightbearing was recommended. Other issues remain stable. Of note patient has a vaginal pessary and should followup with a gynecologist for appropriate care. 1. Nausea/vomiting: Resolved. Secondary to tramadol.  2. UTI: Klebsiella pneumoniae--pansensitive. Asymptomatic. Treated. Allergic to penicillin and apparent reaction to ciprofloxacin this admission. Tolerated ceftriaxone.    3. Acute renal failure: Improving with IV fluids. Probably related to nausea and vomiting.  4. Right shoulder pain: Probably has rotator cuff tendinopathy. X-rays negative.  Physical therapy as an outpatient.  5. Nondisplaced fracture right acetabulum: Most likely related to the fall prior to admission. May begin progressive weightbearing as tolerated on the right. Followup in office with Dr. Lajoyce Gay in 2 weeks.  6. Anemia: Stable. Mild normocytic anemia. Monitor. Consider outpatient evaluation.  7. Hypothyroidism: TSH within normal limits.  8. Vaginal prolapse: Follow-up with gynecology as an outpatient.  9. Dementia: Stable.  Consultants:  Orthopedics   Physical therapy: Skilled nursing facility   Occupational therapy: Skilled nursing facility  Procedures:  None  Antibiotics:  Ceftriaxone 6/25?6/28  Discharge Instructions  Discharge Orders    Future Orders Please Complete By Expires   Diet general      Increase activity slowly      Discharge instructions      Comments:   May begin progressive weightbearing as tolerated on the right. Followup in office with Dr. Lajoyce Gay in 2 weeks.     Medication List  As of 12/04/2011  1:09 PM   STOP taking these medications         chlorthalidone 25 MG tablet  traMADol 50 MG tablet         TAKE these medications         acetaminophen 325 MG tablet    Commonly known as: TYLENOL   Take 2 tablets (650 mg total) by mouth every 6 (six) hours as needed.      aspirin 81 MG tablet   Take 81 mg by mouth daily.      atorvastatin 20 MG tablet   Commonly known as: LIPITOR   Take 20 mg by mouth every evening.      calcium-vitamin D 500-200 MG-UNIT per tablet   Commonly known as: OSCAL WITH D   Take 1 tablet by mouth 2 (two) times daily.      donepezil 10 MG tablet   Commonly known as: ARICEPT   Take 10 mg by mouth at bedtime.      feeding supplement Liqd   Take 237 mLs by mouth daily.      levothyroxine 100 MCG tablet   Commonly known as: SYNTHROID, LEVOTHROID   Take 100 mcg by mouth every morning.      memantine 10 MG tablet   Commonly known as: NAMENDA   Take 10 mg by mouth 2 (two) times daily.      omeprazole 20 MG capsule   Commonly known as: PRILOSEC   Take 20 mg by mouth every evening.           The results of significant diagnostics from this hospitalization (including imaging, microbiology, ancillary and laboratory) are listed below for reference.    Significant Diagnostic Studies: Dg Shoulder Right  12/02/2011  *RADIOLOGY REPORT*  Clinical Data: Shoulder pain.  RIGHT SHOULDER - 2+ VIEW  Comparison: None.  Findings: Three-view exam shows no evidence for an acute fracture. There is no subluxation or dislocation.  Mild degenerative changes are seen in the glenoid.  There is a prominent spur from the undersurface of the acromion versus large intra-articular loose body.  IMPRESSION: No acute bony findings.  Large spur from the undersurface of the acromion may well be causing rotator cuff impingement.  Shoulder MRI would likely prove helpful to further evaluate as clinically warranted.  Original Report Authenticated By: [name], M.D.   Dg Hip Complete Right  12/01/2011  *RADIOLOGY REPORT*  Clinical Data: Post fall, now with right-sided hip pain  RIGHT HIP - COMPLETE 2+ VIEW  Comparison: None.  Findings: No fracture or  dislocation.  Right hip joint spaces are preserved.  There is minimal enthesopathic change of the greater trochanter.  Limited visualization of the adjacent pelvis demonstrates degenerative change of the pubic symphysis. Degenerate change of the lumbar spine is suspected.  Regional soft tissues are normal.  IMPRESSION: No fracture.  Original Report Authenticated By: Waynard Reeds, M.D.   Ct Pelvis Wo Contrast  12/01/2011  *RADIOLOGY REPORT*  Clinical Data:  Right hip pain secondary to a fall.  CT PELVIS WITHOUT CONTRAST  Technique:  Multidetector CT imaging of the pelvis was performed following the standard protocol without intravenous contrast.  Comparison:  Radiographs dated 12/01/2011  Findings:  There is a fracture involving the roof of the right acetabulum extending to the medial wall of the acetabulum and to the base of the right superior pubic ramus.  The ramus is intact. Inferior pubic ramus is normal.  The other pelvic bones are intact.  Proximal femurs are intact.  Incidental note is made of extensive diverticulosis of the sigmoid portion of the colon.  There is  vaginal prolapse.  IMPRESSION:  1.  Nondisplaced fracture of the superior and anterior medial aspects of the right acetabulum. 2.  Vaginal prolapse.  Original Report Authenticated By: Gwynn Burly, M.D.   Microbiology: Recent Results (from the past 240 hour(s))  URINE CULTURE     Status: Normal   Collection Time   12/01/11 12:33 AM      Component Value Range Status Comment   Specimen Description URINE, CATHETERIZED   Final    Special Requests ADDED 0312   Final    Culture  Setup Time [id number]   Final    Colony Count >=100,000 COLONIES/ML   Final    Culture     Final    Value: KLEBSIELLA PNEUMONIAE     Note: Two isolates with different morphologies were identified as the same organism.The most resistant organism was reported.   Report Status 12/03/2011 FINAL   Final    Organism ID, Bacteria KLEBSIELLA PNEUMONIAE   Final      Labs: Basic Metabolic Panel:  Lab 12/04/11 9562 12/03/11 0610 12/02/11 0605 12/01/11 0625 12/01/11 0003  NA 141 143 141 139 143  K 3.5 3.5 3.6 3.9 3.5  CL 107 107 104 103 106  CO2 24 25 27 27 26   GLUCOSE 85 81 85 122* 150*  BUN 26* 29* 32* 33* 34*  CREATININE 1.38* 1.48* 1.61* 1.51* 1.52*  CALCIUM 8.6 9.1 9.1 9.3 9.3  MG -- -- -- -- --  PHOS -- -- -- -- --   Liver Function Tests:  Lab 12/01/11 0625 12/01/11 0003  AST 23 24  ALT 16 18  ALKPHOS 22* 21*  BILITOT 0.3 0.3  PROT 6.5 6.7  ALBUMIN 3.1* 3.3*    Lab 12/01/11 0003  LIPASE 34  AMYLASE --   CBC:  Lab 12/02/11 0605 12/01/11 0625 12/01/11 0003  WBC 7.1 9.5 11.7*  NEUTROABS -- 7.6 9.3*  HGB 10.7* 10.5* 11.1*  HCT 32.0* 32.6* 34.0*  MCV 92.5 91.8 92.4  PLT 219 218 214   CBG:  Lab 11/30/11 2331  GLUCAP 117*    Principal Problem:  *Closed right acetabular fracture Active Problems:  Acute renal failure  CKD (chronic kidney disease), stage III  Hypothyroidism  Dementia  Nausea and vomiting  UTI (lower urinary tract infection)  Right shoulder pain  Rotator cuff tendinitis   Time coordinating discharge: 25 minutes  Signed:  Brendia Sacks, MD [facility] 12/04/2011, 1:09 PM

## 2012-04-07 DIAGNOSIS — R269 Unspecified abnormalities of gait and mobility: Secondary | ICD-10-CM | POA: Insufficient documentation

## 2012-04-13 ENCOUNTER — Other Ambulatory Visit: Payer: Self-pay | Admitting: Internal Medicine

## 2012-09-12 DIAGNOSIS — Z0289 Encounter for other administrative examinations: Secondary | ICD-10-CM

## 2012-10-05 ENCOUNTER — Telehealth: Payer: Self-pay

## 2012-10-05 DIAGNOSIS — R51 Headache: Secondary | ICD-10-CM

## 2012-10-05 NOTE — Telephone Encounter (Signed)
I called the son. The patient began having new onset headaches within the last several weeks. The headaches are not daily, and they are relatively brief. The patient complains of sharp pain in the left frontal area lasting up to 30 minutes. The patient has not had any head trauma. I will get the patient set up for a CT scan of the brain, and a sedimentation rate. If the headaches persist, medical management of this may be needed.

## 2012-10-05 NOTE — Telephone Encounter (Signed)
Son called and said patient has been complaining of headaches on the front left side and her memory has gotten worse.  Says she had TIA in the past and would like to know if there's anything they should do.  Thank you.

## 2012-10-07 ENCOUNTER — Other Ambulatory Visit: Payer: Self-pay | Admitting: Neurology

## 2012-10-10 ENCOUNTER — Other Ambulatory Visit: Payer: Self-pay | Admitting: Neurology

## 2012-10-10 ENCOUNTER — Ambulatory Visit
Admission: RE | Admit: 2012-10-10 | Discharge: 2012-10-10 | Disposition: A | Discharge status: Home / Self Care | Payer: Medicare Other | Source: Ambulatory Visit | Attending: Neurology | Admitting: Neurology

## 2012-10-10 DIAGNOSIS — R51 Headache: Secondary | ICD-10-CM

## 2012-10-11 ENCOUNTER — Telehealth: Payer: Self-pay | Admitting: Neurology

## 2012-10-11 LAB — SEDIMENTATION RATE: Sed Rate: 40 mm/hr (ref 0–40)

## 2012-10-11 NOTE — Telephone Encounter (Signed)
I called the family. The CT of the head shows extensive chronic SVD and some atrophy. The patient is having one or two headaches a week lasting 10 minutes. I the headache worsens, they are to call. The ESR was normal.

## 2012-10-11 NOTE — Progress Notes (Signed)
Quick Note:  Spoke with patient's daughter and relayed results of blood work. Patient understood and had no questions. I also reminded the pt's daughter of the next appointment.  ______

## 2012-11-11 ENCOUNTER — Emergency Department (HOSPITAL_COMMUNITY)
Admission: EM | Admit: 2012-11-11 | Discharge: 2012-11-11 | Disposition: A | Discharge status: Home / Self Care | Payer: Medicare Other | Attending: Emergency Medicine | Admitting: Emergency Medicine

## 2012-11-11 ENCOUNTER — Emergency Department (HOSPITAL_COMMUNITY): Payer: Medicare Other

## 2012-11-11 ENCOUNTER — Encounter (HOSPITAL_COMMUNITY): Payer: Self-pay | Admitting: Emergency Medicine

## 2012-11-11 DIAGNOSIS — Z8742 Personal history of other diseases of the female genital tract: Secondary | ICD-10-CM | POA: Insufficient documentation

## 2012-11-11 DIAGNOSIS — N390 Urinary tract infection, site not specified: Secondary | ICD-10-CM | POA: Insufficient documentation

## 2012-11-11 DIAGNOSIS — Z8673 Personal history of transient ischemic attack (TIA), and cerebral infarction without residual deficits: Secondary | ICD-10-CM | POA: Insufficient documentation

## 2012-11-11 DIAGNOSIS — Z7982 Long term (current) use of aspirin: Secondary | ICD-10-CM | POA: Insufficient documentation

## 2012-11-11 DIAGNOSIS — E785 Hyperlipidemia, unspecified: Secondary | ICD-10-CM | POA: Insufficient documentation

## 2012-11-11 DIAGNOSIS — Z860100 Personal history of colon polyps, unspecified: Secondary | ICD-10-CM | POA: Insufficient documentation

## 2012-11-11 DIAGNOSIS — M810 Age-related osteoporosis without current pathological fracture: Secondary | ICD-10-CM | POA: Insufficient documentation

## 2012-11-11 DIAGNOSIS — R109 Unspecified abdominal pain: Secondary | ICD-10-CM | POA: Insufficient documentation

## 2012-11-11 DIAGNOSIS — E039 Hypothyroidism, unspecified: Secondary | ICD-10-CM | POA: Insufficient documentation

## 2012-11-11 DIAGNOSIS — N289 Disorder of kidney and ureter, unspecified: Secondary | ICD-10-CM | POA: Insufficient documentation

## 2012-11-11 DIAGNOSIS — H9190 Unspecified hearing loss, unspecified ear: Secondary | ICD-10-CM | POA: Insufficient documentation

## 2012-11-11 DIAGNOSIS — Z87891 Personal history of nicotine dependence: Secondary | ICD-10-CM | POA: Insufficient documentation

## 2012-11-11 DIAGNOSIS — Z79899 Other long term (current) drug therapy: Secondary | ICD-10-CM | POA: Insufficient documentation

## 2012-11-11 DIAGNOSIS — F0390 Unspecified dementia without behavioral disturbance: Secondary | ICD-10-CM | POA: Insufficient documentation

## 2012-11-11 DIAGNOSIS — I129 Hypertensive chronic kidney disease with stage 1 through stage 4 chronic kidney disease, or unspecified chronic kidney disease: Secondary | ICD-10-CM | POA: Insufficient documentation

## 2012-11-11 DIAGNOSIS — K219 Gastro-esophageal reflux disease without esophagitis: Secondary | ICD-10-CM | POA: Insufficient documentation

## 2012-11-11 LAB — CBC WITH DIFFERENTIAL/PLATELET
Basophils Relative: 0 % (ref 0–1)
Eosinophils Relative: 1 % (ref 0–5)
HCT: 35.7 % — ABNORMAL LOW (ref 36.0–46.0)
Hemoglobin: 12 g/dL (ref 12.0–15.0)
MCHC: 33.6 g/dL (ref 30.0–36.0)
MCV: 89.7 fL (ref 78.0–100.0)
Monocytes Absolute: 0.8 10*3/uL (ref 0.1–1.0)
Monocytes Relative: 12 % (ref 3–12)
Neutro Abs: 4.8 10*3/uL (ref 1.7–7.7)

## 2012-11-11 LAB — URINALYSIS, ROUTINE W REFLEX MICROSCOPIC
Ketones, ur: NEGATIVE mg/dL
Nitrite: NEGATIVE
Protein, ur: NEGATIVE mg/dL

## 2012-11-11 LAB — COMPREHENSIVE METABOLIC PANEL
Albumin: 3.4 g/dL — ABNORMAL LOW (ref 3.5–5.2)
BUN: 29 mg/dL — ABNORMAL HIGH (ref 6–23)
CO2: 29 mEq/L (ref 19–32)
Chloride: 97 mEq/L (ref 96–112)
Creatinine, Ser: 1.82 mg/dL — ABNORMAL HIGH (ref 0.50–1.10)
GFR calc non Af Amer: 23 mL/min — ABNORMAL LOW (ref >90)
Total Bilirubin: 0.5 mg/dL (ref 0.3–1.2)

## 2012-11-11 LAB — LIPASE, BLOOD: Lipase: 36 U/L (ref 11–59)

## 2012-11-11 MED ORDER — ONDANSETRON 8 MG PO TBDP: 8.0000 mg | ORAL_TABLET | Freq: Three times a day (TID) | ORAL | Status: DC | PRN

## 2012-11-11 MED ORDER — CEPHALEXIN 500 MG PO CAPS: 500.0000 mg | ORAL_CAPSULE | Freq: Four times a day (QID) | ORAL | Status: DC

## 2012-11-11 MED ORDER — ONDANSETRON HCL 4 MG/2ML IJ SOLN
4.0000 mg | Freq: Once | INTRAMUSCULAR | Status: DC
  Filled 2012-11-11: qty 2

## 2012-11-11 MED ORDER — ONDANSETRON HCL 4 MG/2ML IJ SOLN
4.0000 mg | Freq: Once | INTRAMUSCULAR | Status: AC
  Administered 2012-11-11: 4 mg via INTRAVENOUS

## 2012-11-11 MED ORDER — SODIUM CHLORIDE 0.9 % IV SOLN
1000.0000 mL | Freq: Once | INTRAVENOUS | Status: AC
  Administered 2012-11-11: 1000 mL via INTRAVENOUS

## 2012-11-11 MED ORDER — SODIUM CHLORIDE 0.9 % IV BOLUS (SEPSIS): 500.0000 mL | Freq: Once | INTRAVENOUS | Status: DC

## 2012-11-11 NOTE — ED Notes (Addendum)
Patient from home brought in with c/o emesis x 1.  Home health called EMS because of vomiting x 1 yesterday. Diarrhea also reported by home health aide since yesterday.  No fever reported and no recent ABX.

## 2012-11-11 NOTE — ED Provider Notes (Signed)
History     CSN: [id number]  Arrival date & time 11/11/12  1157   First MD Initiated Contact with Patient 11/11/12 1216      Chief Complaint  Patient presents with  . Emesis    (Consider location/radiation/quality/duration/timing/severity/associated sxs/prior treatment) Patient is a 79 y.o. female presenting with vomiting. The history is provided by the patient, a relative and a caregiver.  Emesis  patient here complaining of 24 history of vomiting or diarrhea mild abdominal crampiness. Vomiting has been nonbilious and without blood and is now clear. Diarrhea has been watery without blood. No fever or chills. No cough or shortness of breath. History of similar symptoms in the past associated with neural virus. Patient's son is concerned that she might become dehydrated. No medications used prior to arrival and nothing seems to make her symptoms better worse. She denies any recent antibiotic use or travel history. Denies any urinary symptoms  Past Medical History  Diagnosis Date  . OP (osteoporosis)   . Adenomatous polyps   . Hearing loss   . Hyperlipidemia   . Urge urinary incontinence   . Hypothyroidism   . Cervical prolapse   . Renal insufficiency   . PONV (postoperative nausea and vomiting)   . Hypertension   . GERD (gastroesophageal reflux disease)   . TIA (transient ischemic attack)     "multiple"  . Dementia     "really can't remember anything"    Past Surgical History  Procedure Laterality Date  . Wrist fracture surgery  2008    S/P fall; ?right  . Cataract extraction w/ intraocular lens  implant, bilateral  1990's    Family History  Problem Relation Age of Onset  . Osteoporosis Mother   . Dementia Father     History  Substance Use Topics  . Smoking status: Former Smoker -- 0.50 packs/day for 1 years    Types: Cigarettes    Quit date: 06/08/1942  . Smokeless tobacco: Never Used  . Alcohol Use: Yes     Comment: "last alcohol was before 1994"    OB  History   Grav Para Term Preterm Abortions TAB SAB Ect Mult Living                  Review of Systems  Gastrointestinal: Positive for vomiting.  All other systems reviewed and are negative.    Allergies  Ciprocin-fluocin-procin; Dilaudid; and Penicillins  Home Medications   Current Outpatient Rx  Name  Route  Sig  Dispense  Refill  . acetaminophen (TYLENOL) 325 MG tablet   Oral   Take 2 tablets (650 mg total) by mouth every 6 (six) hours as needed.         Marland Kitchen aspirin 81 MG tablet   Oral   Take 81 mg by mouth daily.           Marland Kitchen atorvastatin (LIPITOR) 20 MG tablet   Oral   Take 20 mg by mouth every evening.         . calcium-vitamin D (OSCAL WITH D) 500-200 MG-UNIT per tablet   Oral   Take 1 tablet by mouth 2 (two) times daily.           Marland Kitchen donepezil (ARICEPT) 10 MG tablet   Oral   Take 10 mg by mouth at bedtime.           . feeding supplement (ENSURE COMPLETE) LIQD   Oral   Take 237 mLs by mouth daily.         Marland Kitchen  levothyroxine (SYNTHROID, LEVOTHROID) 100 MCG tablet      TAKE ONE TABLET BY MOUTH DAILY   90 tablet   2     CYCLE FILL MEDICATION. Authorization is required f ...   . NAMENDA 10 MG tablet      TAKE ONE TABLET BY MOUTH TWICE DAILY   60 tablet   6   . omeprazole (PRILOSEC) 20 MG capsule   Oral   Take 20 mg by mouth every evening.           BP 136/66  Pulse 76  Temp(Src) 97.5 F (36.4 C) (Oral)  Resp 20  SpO2 97%  Physical Exam  Nursing note and vitals reviewed. Constitutional: She is oriented to person, place, and time. She appears well-developed and well-nourished.  Non-toxic appearance. No distress.  HENT:  Head: Normocephalic and atraumatic.  Eyes: Conjunctivae, EOM and lids are normal. Pupils are equal, round, and reactive to light.  Neck: Normal range of motion. Neck supple. No tracheal deviation present. No mass present.  Cardiovascular: Normal rate, regular rhythm and normal heart sounds.  Exam reveals no gallop.    No murmur heard. Pulmonary/Chest: Effort normal and breath sounds normal. No stridor. No respiratory distress. She has no decreased breath sounds. She has no wheezes. She has no rhonchi. She has no rales.  Abdominal: Soft. Normal appearance and bowel sounds are normal. She exhibits no distension. There is generalized tenderness. There is no rigidity, no rebound, no guarding and no CVA tenderness.  Musculoskeletal: Normal range of motion. She exhibits no edema and no tenderness.  Neurological: She is alert and oriented to person, place, and time. She has normal strength. No cranial nerve deficit or sensory deficit. GCS eye subscore is 4. GCS verbal subscore is 5. GCS motor subscore is 6.  Skin: Skin is warm and dry. No abrasion and no rash noted.  Psychiatric: She has a normal mood and affect. Her speech is normal and behavior is normal.    ED Course  Procedures (including critical care time)  Labs Reviewed  CBC WITH DIFFERENTIAL  COMPREHENSIVE METABOLIC PANEL  LIPASE, BLOOD  URINALYSIS, ROUTINE W REFLEX MICROSCOPIC   No results found.   No diagnosis found.    MDM  Pt given iv fluids and zofran--feels better--will tx for uti and pt instructed to have her creat rechecked        Toy Baker, MD 11/11/12 [phone]

## 2012-11-11 NOTE — ED Notes (Signed)
ZOX:[id number]<AV> Expected date:<BR> Expected time:<BR> Means of arrival:<BR> Comments:<BR> ems- 79 yo, vomiting, abdominal pain, from home

## 2012-11-11 NOTE — Progress Notes (Signed)
Pt is followed by [facility] services Pt's PCS aide at her bedside

## 2012-11-14 ENCOUNTER — Encounter: Payer: Self-pay | Admitting: Obstetrics and Gynecology

## 2012-11-14 LAB — URINE CULTURE: Colony Count: >=100000

## 2012-11-15 ENCOUNTER — Telehealth (HOSPITAL_COMMUNITY): Payer: Self-pay | Admitting: Emergency Medicine

## 2012-11-15 ENCOUNTER — Ambulatory Visit: Payer: Self-pay | Admitting: Obstetrics and Gynecology

## 2012-11-15 ENCOUNTER — Ambulatory Visit: Payer: Self-pay | Admitting: Gynecology

## 2012-11-15 NOTE — ED Notes (Signed)
Post ED Visit - Positive Culture Follow-up  Culture report reviewed by antimicrobial stewardship pharmacist: []  Wes Dulaney, Pharm.D., BCPS [x]  Celedonio Miyamoto, Pharm.D., BCPS []  Georgina Pillion, Pharm.D., BCPS []  [place], [address].D., BCPS, AAHIVP []  Estella Husk, Pharm.D., BCPS, AAHIVP  Positive urine culture Treated with Keflex, organism sensitive to the same and no further patient follow-up is required at this time.  Kylie A Holland 11/15/2012, 9:54 AM

## 2012-11-18 ENCOUNTER — Other Ambulatory Visit: Payer: Self-pay | Admitting: Neurology

## 2012-11-18 ENCOUNTER — Encounter: Payer: Self-pay | Admitting: Neurology

## 2012-11-18 DIAGNOSIS — R269 Unspecified abnormalities of gait and mobility: Secondary | ICD-10-CM

## 2012-11-21 ENCOUNTER — Ambulatory Visit: Payer: Self-pay | Admitting: Neurology

## 2012-11-21 ENCOUNTER — Ambulatory Visit (HOSPITAL_COMMUNITY): Payer: Medicare Other

## 2012-11-21 ENCOUNTER — Encounter (HOSPITAL_COMMUNITY): Payer: Self-pay

## 2012-11-21 ENCOUNTER — Emergency Department (HOSPITAL_COMMUNITY): Payer: Medicare Other

## 2012-11-21 ENCOUNTER — Inpatient Hospital Stay (HOSPITAL_COMMUNITY)
Admission: EM | Admit: 2012-11-21 | Discharge: 2012-11-23 | DRG: 392 | Disposition: A | Discharge status: Home / Self Care | Payer: Medicare Other | Attending: Family Medicine | Admitting: Family Medicine

## 2012-11-21 DIAGNOSIS — K5792 Diverticulitis of intestine, part unspecified, without perforation or abscess without bleeding: Secondary | ICD-10-CM

## 2012-11-21 DIAGNOSIS — E785 Hyperlipidemia, unspecified: Secondary | ICD-10-CM | POA: Diagnosis present

## 2012-11-21 DIAGNOSIS — M25511 Pain in right shoulder: Secondary | ICD-10-CM

## 2012-11-21 DIAGNOSIS — Z8673 Personal history of transient ischemic attack (TIA), and cerebral infarction without residual deficits: Secondary | ICD-10-CM

## 2012-11-21 DIAGNOSIS — E039 Hypothyroidism, unspecified: Secondary | ICD-10-CM

## 2012-11-21 DIAGNOSIS — D72829 Elevated white blood cell count, unspecified: Secondary | ICD-10-CM

## 2012-11-21 DIAGNOSIS — Z66 Do not resuscitate: Secondary | ICD-10-CM | POA: Diagnosis present

## 2012-11-21 DIAGNOSIS — I129 Hypertensive chronic kidney disease with stage 1 through stage 4 chronic kidney disease, or unspecified chronic kidney disease: Secondary | ICD-10-CM | POA: Diagnosis present

## 2012-11-21 DIAGNOSIS — E876 Hypokalemia: Secondary | ICD-10-CM

## 2012-11-21 DIAGNOSIS — R5381 Other malaise: Secondary | ICD-10-CM | POA: Diagnosis present

## 2012-11-21 DIAGNOSIS — R112 Nausea with vomiting, unspecified: Secondary | ICD-10-CM

## 2012-11-21 DIAGNOSIS — K5730 Diverticulosis of large intestine without perforation or abscess without bleeding: Secondary | ICD-10-CM

## 2012-11-21 DIAGNOSIS — K648 Other hemorrhoids: Secondary | ICD-10-CM

## 2012-11-21 DIAGNOSIS — Z7982 Long term (current) use of aspirin: Secondary | ICD-10-CM

## 2012-11-21 DIAGNOSIS — N179 Acute kidney failure, unspecified: Secondary | ICD-10-CM

## 2012-11-21 DIAGNOSIS — R269 Unspecified abnormalities of gait and mobility: Secondary | ICD-10-CM

## 2012-11-21 DIAGNOSIS — R197 Diarrhea, unspecified: Secondary | ICD-10-CM

## 2012-11-21 DIAGNOSIS — K5732 Diverticulitis of large intestine without perforation or abscess without bleeding: Principal | ICD-10-CM | POA: Diagnosis present

## 2012-11-21 DIAGNOSIS — Z880 Allergy status to penicillin: Secondary | ICD-10-CM

## 2012-11-21 DIAGNOSIS — K219 Gastro-esophageal reflux disease without esophagitis: Secondary | ICD-10-CM | POA: Diagnosis present

## 2012-11-21 DIAGNOSIS — E7800 Pure hypercholesterolemia, unspecified: Secondary | ICD-10-CM

## 2012-11-21 DIAGNOSIS — D126 Benign neoplasm of colon, unspecified: Secondary | ICD-10-CM

## 2012-11-21 DIAGNOSIS — N3941 Urge incontinence: Secondary | ICD-10-CM | POA: Diagnosis present

## 2012-11-21 DIAGNOSIS — D638 Anemia in other chronic diseases classified elsewhere: Secondary | ICD-10-CM

## 2012-11-21 DIAGNOSIS — N183 Chronic kidney disease, stage 3 unspecified: Secondary | ICD-10-CM

## 2012-11-21 DIAGNOSIS — K5900 Constipation, unspecified: Secondary | ICD-10-CM | POA: Diagnosis present

## 2012-11-21 DIAGNOSIS — R109 Unspecified abdominal pain: Secondary | ICD-10-CM | POA: Diagnosis present

## 2012-11-21 DIAGNOSIS — M810 Age-related osteoporosis without current pathological fracture: Secondary | ICD-10-CM | POA: Diagnosis present

## 2012-11-21 DIAGNOSIS — R531 Weakness: Secondary | ICD-10-CM

## 2012-11-21 DIAGNOSIS — K5641 Fecal impaction: Secondary | ICD-10-CM

## 2012-11-21 DIAGNOSIS — Z79899 Other long term (current) drug therapy: Secondary | ICD-10-CM

## 2012-11-21 DIAGNOSIS — H9190 Unspecified hearing loss, unspecified ear: Secondary | ICD-10-CM | POA: Diagnosis present

## 2012-11-21 DIAGNOSIS — N390 Urinary tract infection, site not specified: Secondary | ICD-10-CM

## 2012-11-21 DIAGNOSIS — Z87891 Personal history of nicotine dependence: Secondary | ICD-10-CM

## 2012-11-21 DIAGNOSIS — F0390 Unspecified dementia without behavioral disturbance: Secondary | ICD-10-CM | POA: Diagnosis present

## 2012-11-21 LAB — URINE MICROSCOPIC-ADD ON

## 2012-11-21 LAB — COMPREHENSIVE METABOLIC PANEL
AST: 23 U/L (ref 0–37)
Albumin: 3.3 g/dL — ABNORMAL LOW (ref 3.5–5.2)
Calcium: 9.6 mg/dL (ref 8.4–10.5)
Creatinine, Ser: 1.56 mg/dL — ABNORMAL HIGH (ref 0.50–1.10)
GFR calc non Af Amer: 28 mL/min — ABNORMAL LOW (ref >90)

## 2012-11-21 LAB — CBC WITH DIFFERENTIAL/PLATELET
Basophils Absolute: 0 10*3/uL (ref 0.0–0.1)
HCT: 34.7 % — ABNORMAL LOW (ref 36.0–46.0)
Lymphocytes Relative: 7 % — ABNORMAL LOW (ref 12–46)
Monocytes Absolute: 0.8 10*3/uL (ref 0.1–1.0)
Neutro Abs: 10.7 10*3/uL — ABNORMAL HIGH (ref 1.7–7.7)
Neutrophils Relative %: 86 % — ABNORMAL HIGH (ref 43–77)
Platelets: 280 10*3/uL (ref 150–400)
RDW: 14.5 % (ref 11.5–15.5)
WBC: 12.4 10*3/uL — ABNORMAL HIGH (ref 4.0–10.5)

## 2012-11-21 LAB — URINALYSIS, ROUTINE W REFLEX MICROSCOPIC
Glucose, UA: NEGATIVE mg/dL
Leukocytes, UA: NEGATIVE
Protein, ur: NEGATIVE mg/dL
pH: 6 (ref 5.0–8.0)

## 2012-11-21 MED ORDER — POTASSIUM CHLORIDE CRYS ER 20 MEQ PO TBCR
20.0000 meq | EXTENDED_RELEASE_TABLET | Freq: Once | ORAL | Status: AC
  Administered 2012-11-21: 20 meq via ORAL
  Filled 2012-11-21: qty 1

## 2012-11-21 MED ORDER — ALBUTEROL SULFATE HFA 108 (90 BASE) MCG/ACT IN AERS: 1.0000 | INHALATION_SPRAY | RESPIRATORY_TRACT | Status: DC | PRN

## 2012-11-21 MED ORDER — ENOXAPARIN SODIUM 30 MG/0.3ML ~~LOC~~ SOLN
30.0000 mg | SUBCUTANEOUS | Status: DC
  Administered 2012-11-21 – 2012-11-22 (×2): 30 mg via SUBCUTANEOUS
  Filled 2012-11-21 (×3): qty 0.3

## 2012-11-21 MED ORDER — METRONIDAZOLE IN NACL 5-0.79 MG/ML-% IV SOLN
500.0000 mg | Freq: Once | INTRAVENOUS | Status: AC
  Administered 2012-11-21: 500 mg via INTRAVENOUS
  Filled 2012-11-21: qty 100

## 2012-11-21 MED ORDER — ONDANSETRON HCL 4 MG/2ML IJ SOLN
4.0000 mg | Freq: Four times a day (QID) | INTRAMUSCULAR | Status: DC | PRN
  Administered 2012-11-23: 4 mg via INTRAVENOUS
  Filled 2012-11-21: qty 2

## 2012-11-21 MED ORDER — ONDANSETRON HCL 4 MG PO TABS: 4.0000 mg | ORAL_TABLET | Freq: Four times a day (QID) | ORAL | Status: DC | PRN

## 2012-11-21 MED ORDER — CHLORTHALIDONE 25 MG PO TABS
25.0000 mg | ORAL_TABLET | Freq: Every morning | ORAL | Status: DC
  Administered 2012-11-21 – 2012-11-23 (×3): 25 mg via ORAL
  Filled 2012-11-21 (×3): qty 1

## 2012-11-21 MED ORDER — MEMANTINE HCL 10 MG PO TABS
10.0000 mg | ORAL_TABLET | Freq: Two times a day (BID) | ORAL | Status: DC
  Administered 2012-11-21 – 2012-11-23 (×4): 10 mg via ORAL
  Filled 2012-11-21 (×5): qty 1

## 2012-11-21 MED ORDER — CIPROFLOXACIN HCL 500 MG PO TABS
500.0000 mg | ORAL_TABLET | Freq: Once | ORAL | Status: AC
  Administered 2012-11-21: 500 mg via ORAL
  Filled 2012-11-21: qty 1

## 2012-11-21 MED ORDER — CIPROFLOXACIN IN D5W 400 MG/200ML IV SOLN: 400.0000 mg | Freq: Two times a day (BID) | INTRAVENOUS | Status: DC

## 2012-11-21 MED ORDER — CIPROFLOXACIN HCL 500 MG PO TABS
500.0000 mg | ORAL_TABLET | Freq: Two times a day (BID) | ORAL | Status: DC
  Administered 2012-11-21 – 2012-11-22 (×2): 500 mg via ORAL
  Filled 2012-11-21 (×4): qty 1

## 2012-11-21 MED ORDER — DONEPEZIL HCL 10 MG PO TABS
10.0000 mg | ORAL_TABLET | Freq: Every evening | ORAL | Status: DC
  Administered 2012-11-21 – 2012-11-22 (×2): 10 mg via ORAL
  Filled 2012-11-21 (×3): qty 1

## 2012-11-21 MED ORDER — ONDANSETRON HCL 4 MG/2ML IJ SOLN
4.0000 mg | Freq: Once | INTRAMUSCULAR | Status: AC
  Administered 2012-11-21: 4 mg via INTRAVENOUS
  Filled 2012-11-21: qty 2

## 2012-11-21 MED ORDER — SODIUM CHLORIDE 0.9 % IV SOLN
1000.0000 mL | INTRAVENOUS | Status: DC
  Administered 2012-11-21: 1000 mL via INTRAVENOUS

## 2012-11-21 MED ORDER — FLEET ENEMA 7-19 GM/118ML RE ENEM
1.0000 | ENEMA | Freq: Once | RECTAL | Status: AC
  Administered 2012-11-21: 1 via RECTAL
  Filled 2012-11-21: qty 1

## 2012-11-21 MED ORDER — ATORVASTATIN CALCIUM 20 MG PO TABS
20.0000 mg | ORAL_TABLET | Freq: Every evening | ORAL | Status: DC
Start: 2012-11-21 — End: 2012-11-23
  Administered 2012-11-21 – 2012-11-22 (×2): 20 mg via ORAL
  Filled 2012-11-21 (×3): qty 1

## 2012-11-21 MED ORDER — SODIUM CHLORIDE 0.9 % IV SOLN
INTRAVENOUS | Status: DC
  Administered 2012-11-21: 20:00:00 via INTRAVENOUS
  Administered 2012-11-22: 1000 mL via INTRAVENOUS
  Administered 2012-11-23: 09:00:00 via INTRAVENOUS

## 2012-11-21 MED ORDER — CALCIUM CARBONATE-VITAMIN D 500-200 MG-UNIT PO TABS
1.0000 | ORAL_TABLET | Freq: Two times a day (BID) | ORAL | Status: DC
  Administered 2012-11-21 – 2012-11-22 (×2): 1 via ORAL
  Filled 2012-11-21 (×3): qty 1

## 2012-11-21 MED ORDER — LEVOTHYROXINE SODIUM 100 MCG PO TABS
100.0000 ug | ORAL_TABLET | Freq: Every day | ORAL | Status: DC
  Administered 2012-11-22 – 2012-11-23 (×2): 100 ug via ORAL
  Filled 2012-11-21 (×3): qty 1

## 2012-11-21 MED ORDER — PANTOPRAZOLE SODIUM 40 MG PO TBEC
40.0000 mg | DELAYED_RELEASE_TABLET | Freq: Every day | ORAL | Status: DC
  Administered 2012-11-21 – 2012-11-23 (×3): 40 mg via ORAL
  Filled 2012-11-21 (×3): qty 1

## 2012-11-21 MED ORDER — DOCUSATE SODIUM 100 MG PO CAPS
100.0000 mg | ORAL_CAPSULE | Freq: Two times a day (BID) | ORAL | Status: DC
  Administered 2012-11-21 – 2012-11-23 (×4): 100 mg via ORAL
  Filled 2012-11-21 (×5): qty 1

## 2012-11-21 MED ORDER — METRONIDAZOLE IN NACL 5-0.79 MG/ML-% IV SOLN
500.0000 mg | Freq: Three times a day (TID) | INTRAVENOUS | Status: DC
  Administered 2012-11-22 – 2012-11-23 (×5): 500 mg via INTRAVENOUS
  Filled 2012-11-21 (×6): qty 100

## 2012-11-21 MED ORDER — ASPIRIN EC 81 MG PO TBEC
81.0000 mg | DELAYED_RELEASE_TABLET | Freq: Every morning | ORAL | Status: DC
  Administered 2012-11-21 – 2012-11-23 (×3): 81 mg via ORAL
  Filled 2012-11-21 (×3): qty 1

## 2012-11-21 MED ORDER — ACETAMINOPHEN 325 MG PO TABS: 650.0000 mg | ORAL_TABLET | Freq: Four times a day (QID) | ORAL | Status: DC | PRN

## 2012-11-21 NOTE — ED Notes (Signed)
ZOX:[id number]<AV> Expected date:<BR> Expected time:<BR> Means of arrival:<BR> Comments:<BR> 79yo-constipation

## 2012-11-21 NOTE — Progress Notes (Signed)
Utilization Review completed.  Crescentia Boutwell RN CM  

## 2012-11-21 NOTE — ED Notes (Signed)
Pt unable to urinate at this time. Nurse was notified. 

## 2012-11-21 NOTE — ED Provider Notes (Signed)
History    CSN: [id number] Arrival date & time 11/21/12  4098 First MD Initiated Contact with Patient 11/21/12 1001      Chief Complaint  Patient presents with  . Constipation   Level V caveat: Dementia HPI Patient presents to emergency room with complaints of constipation. The history is obtained from the patient as well as her caregivers who are here with her. Patient has had trouble with constipation over this weekend. She has not been able to have a normal bowel movement. The symptoms have progressively gotten worse. The patient now is complaining of a lot of pain in the anal and rectal area.  Her caregivers have noted that she appears to have a stool impaction.  She also now is having abdominal pain. She's also developed some nausea. She's not had any fevers.  She has not had any vomiting.  Patient was recently seen in emergent apartment on June 10. She was diagnosed with a urinary tract infection and released. Past Medical History  Diagnosis Date  . OP (osteoporosis)   . Adenomatous polyps   . Hearing loss   . Hyperlipidemia   . Urge urinary incontinence   . Hypothyroidism   . Cervical prolapse   . Renal insufficiency   . PONV (postoperative nausea and vomiting)   . Hypertension   . GERD (gastroesophageal reflux disease)   . TIA (transient ischemic attack)     "multiple"  . Dementia     "really can't remember anything"  . Complete uterine prolapse   . Dementia     Past Surgical History  Procedure Laterality Date  . Wrist fracture surgery  2008    S/P fall; ?right  . Cataract extraction w/ intraocular lens  implant, bilateral  1990's    Family History  Problem Relation Age of Onset  . Osteoporosis Mother   . Dementia Father     History  Substance Use Topics  . Smoking status: Former Smoker -- 0.50 packs/day for 1 years    Types: Cigarettes    Quit date: 06/08/1942  . Smokeless tobacco: Never Used  . Alcohol Use: Yes     Comment: "last alcohol was before 1994"     OB History   Grav Para Term Preterm Abortions TAB SAB Ect Mult Living   5 4 4  1     4       Review of Systems  All other systems reviewed and are negative.    Allergies  Ciprocin-fluocin-procin; Penicillins; Tramadol hcl; and Dilaudid  Home Medications   Current Outpatient Rx  Name  Route  Sig  Dispense  Refill  . acetaminophen (TYLENOL) 325 MG tablet   Oral   Take 650 mg by mouth every 6 (six) hours as needed for pain (headache).         Marland Kitchen aspirin EC 81 MG tablet   Oral   Take 81 mg by mouth every morning.         Marland Kitchen atorvastatin (LIPITOR) 20 MG tablet   Oral   Take 20 mg by mouth every evening.         . calcium-vitamin D (OSCAL WITH D) 500-200 MG-UNIT per tablet   Oral   Take 1 tablet by mouth 2 (two) times daily.           . chlorthalidone (HYGROTON) 25 MG tablet   Oral   Take 25 mg by mouth every morning.         . donepezil (ARICEPT) 10 MG tablet  Oral   Take 10 mg by mouth every evening.         Marland Kitchen levothyroxine (SYNTHROID, LEVOTHROID) 100 MCG tablet   Oral   Take 100 mcg by mouth daily before breakfast.         . memantine (NAMENDA) 10 MG tablet   Oral   Take 10 mg by mouth 2 (two) times daily.         Marland Kitchen omeprazole (PRILOSEC) 20 MG capsule   Oral   Take 20 mg by mouth every evening.         . ondansetron (ZOFRAN ODT) 8 MG disintegrating tablet   Oral   Take 1 tablet (8 mg total) by mouth every 8 (eight) hours as needed for nausea.   20 tablet   0   . cephALEXin (KEFLEX) 500 MG capsule   Oral   Take 1 capsule (500 mg total) by mouth 4 (four) times daily.   28 capsule   0     BP 150/67  Pulse 79  Temp(Src) 98.2 F (36.8 C) (Oral)  Resp 16  SpO2 96%  Physical Exam  Nursing note and vitals reviewed. Constitutional: She appears well-developed and well-nourished. No distress.  HENT:  Head: Normocephalic and atraumatic.  Right Ear: External ear normal.  Left Ear: External ear normal.  Eyes: Conjunctivae are  normal. Right eye exhibits no discharge. Left eye exhibits no discharge. No scleral icterus.  Neck: Neck supple. No tracheal deviation present.  Cardiovascular: Normal rate, regular rhythm and intact distal pulses.   Pulmonary/Chest: Effort normal and breath sounds normal. No stridor. No respiratory distress. She has no wheezes. She has no rales.  Abdominal: Soft. Bowel sounds are normal. She exhibits no distension and no mass. There is generalized tenderness. There is no rebound, no guarding and no CVA tenderness. No hernia.  Genitourinary: Rectal exam shows tenderness. Mass: large fecal impaction.  Anal fissure  Musculoskeletal: She exhibits no edema and no tenderness.  Neurological: She is alert. She has normal strength. No sensory deficit. Cranial nerve deficit:  no gross defecits noted. She exhibits normal muscle tone. She displays no seizure activity. Coordination normal.  Skin: Skin is warm and dry. No rash noted.  Psychiatric: She has a normal mood and affect.    ED Course  Fecal disimpaction Date/Time: 11/21/2012 10:17 AM Performed by: Linwood Dibbles R Authorized by: Linwood Dibbles R Consent: Verbal consent obtained. Local anesthesia used: no Patient sedated: no Patient tolerance: Patient tolerated the procedure well with no immediate complications. Comments: Large amount of brown stool removed during procedure. There still appears to be residual stool in the rectal vault   (including critical care time)  Labs Reviewed  COMPREHENSIVE METABOLIC PANEL - Abnormal; Notable for the following:    Potassium 3.3 (*)    Glucose, Bld 118 (*)    BUN 24 (*)    Creatinine, Ser 1.56 (*)    Albumin 3.3 (*)    Alkaline Phosphatase 25 (*)    GFR calc non Af Amer 28 (*)    GFR calc Af Amer 33 (*)    All other components within normal limits  CBC WITH DIFFERENTIAL - Abnormal; Notable for the following:    WBC 12.4 (*)    Hemoglobin 11.7 (*)    HCT 34.7 (*)    Neutrophils Relative % 86 (*)     Neutro Abs 10.7 (*)    Lymphocytes Relative 7 (*)    All other components within normal limits  LIPASE,  BLOOD  URINALYSIS, ROUTINE W REFLEX MICROSCOPIC   Ct Abdomen Pelvis Wo Contrast  11/21/2012   *RADIOLOGY REPORT*  Clinical Data: Abdominal pain for 3 days.  History of dementia and constipation.  CT ABDOMEN AND PELVIS WITHOUT CONTRAST  Technique:  Multidetector CT imaging of the abdomen and pelvis was performed following the standard protocol without intravenous contrast.  Comparison: Acute abdominal series 11/21/2012.  Pelvic CT 12/01/2011.  Findings: The lung bases are clear.  There is no pleural or pericardial effusion.  A small hiatal hernia and mild aortic atherosclerosis are noted.  There are well-circumscribed low density hepatic lesions which are likely cysts.  The largest measure 1.3 cm on image number 11 and 1.0 cm on image 15.  No suspicious liver lesions are identified. The spleen, gallbladder, pancreas and adrenal glands appear normal. Both kidneys demonstrate mild cortical thinning.  There is a 6 mm exophytic lesion projecting posteriorly from the mid right kidney on image 29.  There is diffuse aorto iliac atherosclerosis.  No enlarged abdominal pelvic lymph nodes are identified.  The stomach and small bowel appear normal aside from a proximal duodenal diverticulum. There is extensive diverticulosis of the sigmoid colon with associated mural thickening and possible mild adjacent inflammation.  No pericolonic abscess or perforation is identified. The appendix appears normal.  Vaginal pessary is noted.  The bladder, uterus and adnexa appear unremarkable. Old pelvic fractures and lumbar spondylosis are noted.  There are no worrisome osseous findings.  IMPRESSION:  1.  Extensive sigmoid diverticulosis with possible associated mild acute inflammation.  No evidence of perforation, abscess or bowel obstruction. 2.  No other acute findings identified. 3.  Extensive atherosclerosis. 4.  Low density  liver lesions, likely incidental cysts.   Original Report Authenticated By: Carey Bullocks, M.D.   Dg Abd Acute W/chest  11/21/2012   *RADIOLOGY REPORT*  Clinical Data: Abdominal pain with generalized weakness. Constipation.  ACUTE ABDOMEN SERIES (ABDOMEN 2 VIEW & CHEST 1 VIEW)  Comparison: Acute abdominal series 11/11/2012.  Pelvic CT 12/01/2011.  Findings: The heart size and mediastinal contours are stable with aortic atherosclerosis.  The lungs are clear.  There is no pleural effusion or pneumothorax.  The bowel gas pattern is nonobstructive.  There is moderate stool within the rectum.  There is no evidence of free intraperitoneal air or suspicious abdominal calcification.  Scattered vascular calcifications are noted.  IMPRESSION: No acute cardiopulmonary or abdominal process demonstrated.  Mildly prominent stool in the rectum.   Original Report Authenticated By: Carey Bullocks, M.D.     1. Diverticulitis   2. Fecal impaction       MDM  Pt has constipation/obstipation.  Also with possible diverticulitis.  Will start on IV abx.  Plan on admission for further treatment.   Pt is allergic to cipro IV and penicillins.  She can take oral cipro per family.          Celene Kras, MD 11/21/12 [phone]

## 2012-11-21 NOTE — ED Notes (Signed)
Notified Kristie, CT that pt has consumed contrast.

## 2012-11-21 NOTE — ED Notes (Addendum)
Per EMS, Pt, from home, c/o constipation and abdominal pain x 3 days.  Pain score 8/10.  EMS sts Pt had a BM after calling for ambulance.  Pt sts BM was very painful.  A & Ox4.  Pt was seen on 6/13 for diarrhea.

## 2012-11-21 NOTE — ED Notes (Signed)
[name] from lab called to notify that not enough urine was obtained to test.  Will recollect later.  MD notified.

## 2012-11-21 NOTE — H&P (Signed)
[facility] History and Physical  Nelwyn Hebdon Atiyeh WUJ:[id number] DOB: Mar 30, 1921 DOA: 11/21/2012  Referring physician: Dr Jethro Bolus PCP: Almedia Balls, MD  Specialists: none  Chief Complaint: Nausea, abdominal pain.   HPI: [name] is a 79 y.o. female with PMH significant for dementia, hypothyroidism, CKD stage III, recently treated for klebsiella with keflex, who presents with abdominal pain, constipation. Patient was having rectal pain. Patient had some nausea, no vomiting. She was not able to eat breakfast today. No fever. She was notice to have fecal impaction on rectal exam perform by dr Nap. Patient was disimpacted in the ED. She had a CT scan that show diverticulosis with mild inflammation.   Review of Systems: Negative except as per HPI.   Past Medical History  Diagnosis Date  . OP (osteoporosis)   . Adenomatous polyps   . Hearing loss   . Hyperlipidemia   . Urge urinary incontinence   . Hypothyroidism   . Cervical prolapse   . Renal insufficiency   . PONV (postoperative nausea and vomiting)   . Hypertension   . GERD (gastroesophageal reflux disease)   . TIA (transient ischemic attack)     "multiple"  . Dementia     "really can't remember anything"  . Complete uterine prolapse   . Dementia    Past Surgical History  Procedure Laterality Date  . Wrist fracture surgery  2008    S/P fall; ?right  . Cataract extraction w/ intraocular lens  implant, bilateral  1990's   Social History:  reports that she quit smoking about 70 years ago. Her smoking use included Cigarettes. She has a .5 pack-year smoking history. She has never used smokeless tobacco. She reports that  drinks alcohol. She reports that she does not use illicit drugs. She live at home, she has 24 hours care.    Allergies  Allergen Reactions  . Ciprocin-Fluocin-Procin (Fluocinolone) Rash    Reaction on arm IV  . Penicillins Other (See Comments)    Reaction unknown   . Tramadol Hcl Nausea And Vomiting  .  Dilaudid (Hydromorphone Hcl) Nausea And Vomiting    Family History  Problem Relation Age of Onset  . Osteoporosis Mother   . Dementia Father     Prior to Admission medications   Medication Sig Start Date End Date Taking? Authorizing Provider  acetaminophen (TYLENOL) 325 MG tablet Take 650 mg by mouth every 6 (six) hours as needed for pain (headache). 12/04/11 12/03/12 Yes Standley Brooking, MD  aspirin EC 81 MG tablet Take 81 mg by mouth every morning.   Yes Historical Provider, MD  atorvastatin (LIPITOR) 20 MG tablet Take 20 mg by mouth every evening.   Yes Historical Provider, MD  calcium-vitamin D (OSCAL WITH D) 500-200 MG-UNIT per tablet Take 1 tablet by mouth 2 (two) times daily.     Yes Historical Provider, MD  chlorthalidone (HYGROTON) 25 MG tablet Take 25 mg by mouth every morning.   Yes Historical Provider, MD  donepezil (ARICEPT) 10 MG tablet Take 10 mg by mouth every evening.   Yes Historical Provider, MD  levothyroxine (SYNTHROID, LEVOTHROID) 100 MCG tablet Take 100 mcg by mouth daily before breakfast.   Yes Historical Provider, MD  memantine (NAMENDA) 10 MG tablet Take 10 mg by mouth 2 (two) times daily.   Yes Historical Provider, MD  omeprazole (PRILOSEC) 20 MG capsule Take 20 mg by mouth every evening.   Yes Historical Provider, MD  ondansetron (ZOFRAN ODT) 8 MG disintegrating tablet Take 1  tablet (8 mg total) by mouth every 8 (eight) hours as needed for nausea. 11/11/12  Yes Toy Baker, MD  cephALEXin (KEFLEX) 500 MG capsule Take 1 capsule (500 mg total) by mouth 4 (four) times daily. 11/11/12   Toy Baker, MD   Physical Exam: Filed Vitals:   11/21/12 4782 11/21/12 1000  BP:  150/67  Pulse:  79  Temp:  98.2 F (36.8 C)  TempSrc:  Oral  Resp:  16  SpO2: 98% 96%   General Appearance:    Alert, cooperative, no distress, appears stated age  Head:    Normocephalic, without obvious abnormality, atraumatic  Eyes:    PERRL, conjunctiva/corneas clear, EOM's intact,     Ears:    Normal TM's and external ear canals, both ears  Nose:   Nares normal, septum midline, mucosa normal, no drainage    or sinus tenderness  Throat:   Lips, mucosa, and tongue normal; teeth and gums normal  Neck:   Supple, symmetrical, trachea midline, no adenopathy;    thyroid:  no enlargement/tenderness/nodules; no carotid   bruit or JVD  Back:     Symmetric, no curvature, ROM normal, no CVA tenderness  Lungs:     Clear to auscultation bilaterally, respirations unlabored  Chest Wall:    No tenderness or deformity   Heart:    Regular rate and rhythm, S1 and S2 normal, no murmur, rub   or gallop     Abdomen:     Soft, non-tender, bowel sounds active all four quadrants,    no masses, no organomegaly        Extremities:   Extremities normal, atraumatic, no cyanosis or edema  Pulses:   2+ and symmetric all extremities  Skin:   Skin color, texture, turgor normal, no rashes or lesions  Lymph nodes:   Cervical, supraclavicular, and axillary nodes normal  Neurologic:   CNII-XII intact, normal strength, sensation and reflexes    throughout      Labs on Admission:  Basic Metabolic Panel:  Recent Labs Lab 11/21/12 1050  NA 138  K 3.3*  CL 98  CO2 28  GLUCOSE 118*  BUN 24*  CREATININE 1.56*  CALCIUM 9.6   Liver Function Tests:  Recent Labs Lab 11/21/12 1050  AST 23  ALT 18  ALKPHOS 25*  BILITOT 0.5  PROT 7.0  ALBUMIN 3.3*    Recent Labs Lab 11/21/12 1050  LIPASE 41   No results found for this basename: AMMONIA,  in the last 168 hours CBC:  Recent Labs Lab 11/21/12 1050  WBC 12.4*  NEUTROABS 10.7*  HGB 11.7*  HCT 34.7*  MCV 89.7  PLT 280   Cardiac Enzymes: No results found for this basename: CKTOTAL, CKMB, CKMBINDEX, TROPONINI,  in the last 168 hours  BNP (last 3 results) No results found for this basename: PROBNP,  in the last 8760 hours CBG: No results found for this basename: GLUCAP,  in the last 168 hours  Radiological Exams on  Admission: Ct Abdomen Pelvis Wo Contrast  11/21/2012   *RADIOLOGY REPORT*  Clinical Data: Abdominal pain for 3 days.  History of dementia and constipation.  CT ABDOMEN AND PELVIS WITHOUT CONTRAST  Technique:  Multidetector CT imaging of the abdomen and pelvis was performed following the standard protocol without intravenous contrast.  Comparison: Acute abdominal series 11/21/2012.  Pelvic CT 12/01/2011.  Findings: The lung bases are clear.  There is no pleural or pericardial effusion.  A small hiatal hernia and mild  aortic atherosclerosis are noted.  There are well-circumscribed low density hepatic lesions which are likely cysts.  The largest measure 1.3 cm on image number 11 and 1.0 cm on image 15.  No suspicious liver lesions are identified. The spleen, gallbladder, pancreas and adrenal glands appear normal. Both kidneys demonstrate mild cortical thinning.  There is a 6 mm exophytic lesion projecting posteriorly from the mid right kidney on image 29.  There is diffuse aorto iliac atherosclerosis.  No enlarged abdominal pelvic lymph nodes are identified.  The stomach and small bowel appear normal aside from a proximal duodenal diverticulum. There is extensive diverticulosis of the sigmoid colon with associated mural thickening and possible mild adjacent inflammation.  No pericolonic abscess or perforation is identified. The appendix appears normal.  Vaginal pessary is noted.  The bladder, uterus and adnexa appear unremarkable. Old pelvic fractures and lumbar spondylosis are noted.  There are no worrisome osseous findings.  IMPRESSION:  1.  Extensive sigmoid diverticulosis with possible associated mild acute inflammation.  No evidence of perforation, abscess or bowel obstruction. 2.  No other acute findings identified. 3.  Extensive atherosclerosis. 4.  Low density liver lesions, likely incidental cysts.   Original Report Authenticated By: Carey Bullocks, M.D.   Dg Abd Acute W/chest  11/21/2012   *RADIOLOGY  REPORT*  Clinical Data: Abdominal pain with generalized weakness. Constipation.  ACUTE ABDOMEN SERIES (ABDOMEN 2 VIEW & CHEST 1 VIEW)  Comparison: Acute abdominal series 11/11/2012.  Pelvic CT 12/01/2011.  Findings: The heart size and mediastinal contours are stable with aortic atherosclerosis.  The lungs are clear.  There is no pleural effusion or pneumothorax.  The bowel gas pattern is nonobstructive.  There is moderate stool within the rectum.  There is no evidence of free intraperitoneal air or suspicious abdominal calcification.  Scattered vascular calcifications are noted.  IMPRESSION: No acute cardiopulmonary or abdominal process demonstrated.  Mildly prominent stool in the rectum.   Original Report Authenticated By: Carey Bullocks, M.D.    Assessment/Plan Active Problems:   Hypothyroidism   Dementia   Acute renal failure   CKD (chronic kidney disease), stage III   Diverticulitis  1-Mild Diverticulitis: Will start IV flagyl, continue with oral Cipro. Clear diet. WBC at 12. IV fluids.  2-Constipation: S/P disimpaction. Will start stool softener. Nutrition consult for diet education.  3-CKD stage III: Cr today at 1.5 improved from 1.8 on last ED visit. Avoid nephrotoxin.  4-Dementia: Continue with current medications.  5-GERD: Continue with protonix.  6-Hypokalemia: replete with 20 meq of K-cl.  7-Prior history of UTI; repeat UA,     Family Communication: Discussed care with Multiple family member at bedside.  Disposition Plan: expect 3 to 4 days inpatient.   Time spent: 75 minutes.   Amariya Liskey [facility] Pager [phone]  If 7PM-7AM, please contact night-coverage [email or web address] Password [facility] 11/21/2012, 6:04 PM

## 2012-11-22 DIAGNOSIS — R109 Unspecified abdominal pain: Secondary | ICD-10-CM | POA: Diagnosis present

## 2012-11-22 DIAGNOSIS — D638 Anemia in other chronic diseases classified elsewhere: Secondary | ICD-10-CM | POA: Diagnosis present

## 2012-11-22 DIAGNOSIS — E876 Hypokalemia: Secondary | ICD-10-CM | POA: Diagnosis present

## 2012-11-22 DIAGNOSIS — D72829 Elevated white blood cell count, unspecified: Secondary | ICD-10-CM | POA: Diagnosis present

## 2012-11-22 LAB — BASIC METABOLIC PANEL
Chloride: 100 mEq/L (ref 96–112)
GFR calc Af Amer: 35 mL/min — ABNORMAL LOW (ref >90)
GFR calc non Af Amer: 30 mL/min — ABNORMAL LOW (ref >90)
Potassium: 3.1 mEq/L — ABNORMAL LOW (ref 3.5–5.1)
Sodium: 134 mEq/L — ABNORMAL LOW (ref 135–145)

## 2012-11-22 LAB — CBC
HCT: 28.9 % — ABNORMAL LOW (ref 36.0–46.0)
Hemoglobin: 9.5 g/dL — ABNORMAL LOW (ref 12.0–15.0)
WBC: 8.3 10*3/uL (ref 4.0–10.5)

## 2012-11-22 MED ORDER — CALCIUM CARBONATE ANTACID 500 MG PO CHEW
400.0000 mg | CHEWABLE_TABLET | Freq: Two times a day (BID) | ORAL | Status: DC
  Administered 2012-11-22 – 2012-11-23 (×2): 400 mg via ORAL
  Filled 2012-11-22 (×3): qty 2

## 2012-11-22 MED ORDER — CHOLECALCIFEROL 10 MCG (400 UNIT) PO TABS
400.0000 [IU] | ORAL_TABLET | Freq: Every day | ORAL | Status: DC
  Administered 2012-11-22 – 2012-11-23 (×2): 400 [IU] via ORAL
  Filled 2012-11-22 (×2): qty 1

## 2012-11-22 MED ORDER — POTASSIUM CHLORIDE CRYS ER 20 MEQ PO TBCR
40.0000 meq | EXTENDED_RELEASE_TABLET | Freq: Once | ORAL | Status: AC
  Administered 2012-11-22: 40 meq via ORAL
  Filled 2012-11-22: qty 2

## 2012-11-22 MED ORDER — CIPROFLOXACIN HCL 500 MG PO TABS
500.0000 mg | ORAL_TABLET | Freq: Every day | ORAL | Status: DC
  Administered 2012-11-23: 500 mg via ORAL
  Filled 2012-11-22 (×2): qty 1

## 2012-11-22 NOTE — Progress Notes (Signed)
Patient ID: [name], female   DOB: [birth year], 79 y.o.   MRN: [id number] [facility] PROGRESS NOTE  Clydie Dillen Dugdale NFA:[id number] DOB: 04-Oct-1920 DOA: 11/21/2012 PCP: Almedia Balls, MD  Brief narrative: Pt is very pleasant 79 y.o. female with PMH significant for dementia, hypothyroidism, CKD stage III, recently treated for klebsiella with keflex, who presented to [facility] ED with main concern of progressively worsening generalized abdominal pain that started several days prior to admission, intermittent and dull, 5/10 in severity when present and associated with 4-5 days history of constipation, nausea and poor oral intake. In ED, she was noted to have fecal impaction and was disimpacted, CT abdomen and pelvis with diverticulosis and ? diverticulitis. Pt started on Cipro and Flagyl, [facility] asked to admit for further evaluation.   Principal Problem:   Abdominal  pain, other specified site with nausea  - this is likely multifactorial and secondary to constipation, ? diverticulitis - will continue supportive care with analgesia and antiemetics as needed - will advance diet as pt able to tolerate, bowel regimen - continue empiric ABX for now Cipro and Flagyl day #2 Active Problems:   Leukocytosis, unspecified - likely secondary to principal problem, ? Constipation and diverticulitis  - now resolved, will still continue empiric ABX, will decide in AM if needs to be continued  - CBC in AM   Hypothyroidism - continue synthroid    Dementia - appears to be at her baseline    Acute renal failure - imposed on chronic kidney disease stage III - creatinine is trending down, encourage PO intake - BMP in AM   Hypokalemia - mild, will continue to supplement - BMP in AM   Anemia due to chronic illness - slight drop in Hg since admission, likely dilutional component  - repeat CBC in AM   CKD (chronic kidney disease), stage III - creatinine trending down, BMP in  AM  Consultants:  None  Procedures/Studies: Ct Abdomen Pelvis Wo Contrast 11/21/2012   1.  Extensive sigmoid diverticulosis with possible associated mild acute inflammation.  No evidence of perforation, abscess or bowel obstruction.  2.  No other acute findings identified.  3.  Extensive atherosclerosis.  4.  Low density liver lesions, likely incidental cysts.    Dg Abd Acute W/chest 11/21/2012    No acute cardiopulmonary or abdominal process demonstrated.  Mildly prominent stool in the rectum.     Antibiotics:  Cipro 6/16 -->  Flagyl 6/16 -->  Code Status: DNR Family Communication: Daughter and caregiver at bedside Disposition Plan: Home when medically stable  HPI/Subjective: No events overnight.   Objective: Filed Vitals:   11/21/12 1000 11/21/12 1924 11/21/12 2000 11/22/12 0711  BP: 150/67 142/58 120/64 125/41  Pulse: 79 70 68 51  Temp: 98.2 F (36.8 C)  98.4 F (36.9 C) 98.2 F (36.8 C)  TempSrc: Oral  Oral Oral  Resp: 16 18 16 16   Height:   5\' 4"  (1.626 m)   Weight:   64 kg (141 lb 1.5 oz)   SpO2: 96% 100% 100% 99%    Intake/Output Summary (Last 24 hours) at 11/22/12 0925 Last data filed at 11/22/12 0755  Gross per 24 hour  Intake    360 ml  Output      0 ml  Net    360 ml    Exam:   General:  Pt is alert, follows commands appropriately, not in acute distress  Cardiovascular: Regular rate and rhythm, S1/S2, no murmurs, no rubs, no gallops  Respiratory: Clear to auscultation bilaterally, no wheezing, no crackles, no rhonchi  Abdomen: Soft, non tender, non distended, bowel sounds present, no guarding  Extremities: No edema, pulses DP and PT palpable bilaterally  Neuro: Grossly nonfocal  Data Reviewed: Basic Metabolic Panel:  Recent Labs Lab 11/21/12 1050 11/22/12 0433  NA 138 134*  K 3.3* 3.1*  CL 98 100  CO2 28 27  GLUCOSE 118* 87  BUN 24* 20  CREATININE 1.56* 1.47*  CALCIUM 9.6 8.6   Liver Function Tests:  Recent Labs Lab  11/21/12 1050  AST 23  ALT 18  ALKPHOS 25*  BILITOT 0.5  PROT 7.0  ALBUMIN 3.3*    Recent Labs Lab 11/21/12 1050  LIPASE 41   CBC:  Recent Labs Lab 11/21/12 1050 11/22/12 0433  WBC 12.4* 8.3  NEUTROABS 10.7*  --   HGB 11.7* 9.5*  HCT 34.7* 28.9*  MCV 89.7 89.5  PLT 280 194    Scheduled Meds: . aspirin EC  81 mg Oral q morning - 10a  . atorvastatin  20 mg Oral QPM  . calcium-vitamin D  1 tablet Oral BID  . chlorthalidone  25 mg Oral q morning - 10a  . ciprofloxacin  500 mg Oral BID  . docusate sodium  100 mg Oral BID  . donepezil  10 mg Oral QPM  . enoxaparin injection  30 mg Subcutaneous Q24H  . levothyroxine  100 mcg Oral QAC breakfast  . memantine  10 mg Oral BID  . metronidazole  500 mg Intravenous Q8H  . pantoprazole  40 mg Oral Daily   Continuous Infusions: . sodium chloride 50 mL/hr at 11/21/12 2009    Debbora Presto, MD  [facility] Pager [phone]  If 7PM-7AM, please contact night-coverage [email or web address] Password [email or web address] 11/22/2012, 9:25 AM   LOS: 1 day

## 2012-11-22 NOTE — Evaluation (Signed)
Clinical/Bedside Swallow Evaluation Patient Details  Name: [name] MRN: [id number] Date of Birth: Jul 21, 1920  Today's Date: 11/22/2012 Time: 6578-4696 SLP Time Calculation (min): 41 min  Past Medical History:  Past Medical History  Diagnosis Date  . OP (osteoporosis)   . Adenomatous polyps   . Hearing loss   . Hyperlipidemia   . Urge urinary incontinence   . Hypothyroidism   . Cervical prolapse   . Renal insufficiency   . PONV (postoperative nausea and vomiting)   . Hypertension   . GERD (gastroesophageal reflux disease)   . TIA (transient ischemic attack)     "multiple"  . Dementia     "really can't remember anything"  . Complete uterine prolapse   . Dementia    Past Surgical History:  Past Surgical History  Procedure Laterality Date  . Wrist fracture surgery  2008    S/P fall; ?right  . Cataract extraction w/ intraocular lens  implant, bilateral  1990's   HPI:  79 yo female adm to [facility] due to N/V likely due to constipation per MD note.  Abdomen studies showed diverticulosis with mild inflammation.  Pt PMH + for GERD, hearing loss, HLD, TIA, dementia.  Pt intake in hospital has been approx 50% today and she is afebrile.      Assessment / Plan / Recommendation Clinical Impression  Pt presents with overall functional swallow with timely swallow and clear voice throughout.  Pt was observed to be wincing during the swallow but denied pain associated with pain.  Multilple swallows of liquids noted - suspect piecemeal- which is functional.   Pt without indication of oropharyngeal stasis or aspiration or penetration.    Per sitter, pt with good intake at home until this week - pt had a UTI last week and N/V episode prior to admit.   Pt also feeds herself which maximizes her airway protection and has not had pulmonary infections.  Per pt's private CNA, pt had to swallow her medicine earlier today crushed with applesauce as she was spitting it out.  Pt's home CNA also states  family questions if pt is having problems swallowing in her throat- ? if pt with pharyngeal/laryngeal agitation secondary to N/V episode or if referrant sensation due to known GERD (pt on PPI).  Today pt denies any dysphagia - but she has cognitive deficits.  Do not suspect pt with primary oropharyngeal deficit.  Advised CNA to know heimlich manuever for emergent use, aspiration precautions and gustatory changes as people age, and importance of hydration.  Will follow up one time for family education.   Rec continue regular/thin diet with precautions.      Aspiration Risk  Mild    Diet Recommendation Regular;Thin liquid   Liquid Administration via: Cup;Straw Medication Administration:  (as tolerated) Supervision: Patient able to self feed Compensations: Slow rate;Small sips/bites Postural Changes and/or Swallow Maneuvers: Seated upright 90 degrees;Upright 30-60 min after meal    Other  Recommendations Oral Care Recommendations: Oral care BID   Follow Up Recommendations  None    Frequency and Duration min 1 x/week  1 week   Pertinent Vitals/Pain Afebrile, clear    SLP Swallow Goals Goal #3: Family will verbalize general aspiration precautions to mitigate aspiration risk and possible modification indicated with min assist.    Swallow Study Prior Functional Status   feeds self regular thin diet at home, good intake until recently    General Date of Onset: 11/22/12 HPI: 79 yo female adm to [facility] due  to N/V likely due to constipation per MD note.  Abdomen studies showed diverticulosis with mild inflammation.  Pt PMH + for GERD, hearing loss, HLD, TIA, dementia.  Pt intake in hospital has been approx 50% today and she is afebrile.    Type of Study: Bedside swallow evaluation Diet Prior to this Study: Regular;Thin liquids Temperature Spikes Noted: No Respiratory Status: Room air History of Recent Intubation: No Behavior/Cognition: Alert;Cooperative;Pleasant mood;Hard of hearing Oral  Cavity - Dentition: Adequate natural dentition Self-Feeding Abilities: Able to feed self Patient Positioning: Upright in bed Baseline Vocal Quality: Clear Volitional Cough: Strong Volitional Swallow: Able to elicit    Oral/Motor/Sensory Function Overall Oral Motor/Sensory Function: Appears within functional limits for tasks assessed   Ice Chips Ice chips: Not tested   Thin Liquid Thin Liquid: Within functional limits Presentation: Self Fed;Straw    Nectar Thick Nectar Thick Liquid: Not tested   Honey Thick Honey Thick Liquid: Not tested   Puree Puree: Not tested   Solid   GO    Solid: Within functional limits Other Comments: SLP fed pt due to IV in right arm and inability to bend right arm       Laura Burnet, MS [facility] SLP [phone]

## 2012-11-22 NOTE — Progress Notes (Signed)
  RD consulted for nutrition education diverticulosis and constipation.  Body mass index is 24.21 kg/(m^2). Pt meets criteria for normal based on current BMI.  RD provided "High Fiber Nutrition Therapy" handout from the Academy of [employer]. Pt's daughter and caregiver were present in room and participated in diet education. Provided examples of ways to balance meals/snacks and encouraged intake of high-fiber, whole grain complex carbohydrates. Encouraged balanced intake of fruits and vegetables; provided list of recommended fruits and vegetables. Emphasized the importance of hydration; pt reports drinking a lot of sweet tea.  Pt is willing to drink 1 cup of juice and 1 cup of water daily in place of sweet tea to help pt stay better hydrated. Pt reports eating peaches, yogurt, and lean cuisine dinners (with vegetables) regularly; encouraged pt to continue this habit. Pt recently switched from eating cheerios to chocolate puff cereal; encouraged pt to switch back to cereal with more fiber. Recommended avoiding berries and nuts until diverticular inflammation resolves. Teach back method used.  Expect good compliance.  Current diet order is Clear Liquid, patient is consuming approximately 100% of meals at this time. Pt reports having a good appetite and eating well PTA. Pt is maintaining weight around 140 lbs. Labs and medications reviewed. No further nutrition interventions warranted at this time. RD contact information provided. If additional nutrition issues arise, please re-consult RD.  Ian Malkin RD, LDN Inpatient Clinical Dietitian Pager: [phone] After Hours Pager: [phone]

## 2012-11-23 DIAGNOSIS — D638 Anemia in other chronic diseases classified elsewhere: Secondary | ICD-10-CM

## 2012-11-23 LAB — CBC
HCT: 28.6 % — ABNORMAL LOW (ref 36.0–46.0)
Hemoglobin: 9.4 g/dL — ABNORMAL LOW (ref 12.0–15.0)
RDW: 15 % (ref 11.5–15.5)
WBC: 6.5 10*3/uL (ref 4.0–10.5)

## 2012-11-23 LAB — BASIC METABOLIC PANEL
BUN: 22 mg/dL (ref 6–23)
Chloride: 108 mEq/L (ref 96–112)
GFR calc Af Amer: 29 mL/min — ABNORMAL LOW (ref >90)
GFR calc non Af Amer: 25 mL/min — ABNORMAL LOW (ref >90)
Glucose, Bld: 88 mg/dL (ref 70–99)
Potassium: 4.4 mEq/L (ref 3.5–5.1)
Sodium: 138 mEq/L (ref 135–145)

## 2012-11-23 MED ORDER — POLYETHYLENE GLYCOL 3350 17 G PO PACK
17.0000 g | PACK | Freq: Every day | ORAL | Status: DC | PRN
  Filled 2012-11-23: qty 1

## 2012-11-23 MED ORDER — METRONIDAZOLE 500 MG PO TABS: 500.0000 mg | ORAL_TABLET | Freq: Three times a day (TID) | ORAL | Status: DC

## 2012-11-23 MED ORDER — CIPROFLOXACIN HCL 500 MG PO TABS: 500.0000 mg | ORAL_TABLET | Freq: Every day | ORAL | Status: AC

## 2012-11-23 NOTE — Evaluation (Signed)
Physical Therapy Evaluation Patient Details Name: [name] MRN: [id number] DOB: 03/08/1921 Today's Date: 11/23/2012 Time: 4098-1191 PT Time Calculation (min): 21 min  PT Assessment / Plan / Recommendation Clinical Impression  79 yo female admitted with abdominal pain. Pt has hx of dementia, hypothyroidism, CKD. Pt has 24 hour aide care. On eval, pt required Min assist for mobility-able to ambulate 100 feet with RW. Family and aide present-aide reports pt is close to baseline with respect to  mobility. Do not feel pt needs any follow up PT at discharge.     PT Assessment  Patient needs continued PT services    Follow Up Recommendations  No PT follow up;Supervision/Assistance - 24 hour    Does the patient have the potential to tolerate intense rehabilitation      Barriers to Discharge        Equipment Recommendations  None recommended by PT    Recommendations for Other Services     Frequency Min 3X/week    Precautions / Restrictions Precautions Precautions: Fall Restrictions Weight Bearing Restrictions: No   Pertinent Vitals/Pain No c/o pain      Mobility  Bed Mobility Bed Mobility: Supine to Sit Supine to Sit: 3: Mod assist Details for Bed Mobility Assistance: Assist for bil LEs off bed and trunk to upright.  Transfers Transfers: Sit to Stand;Stand to Sit Sit to Stand: 4: Min assist;From bed Stand to Sit: 4: Min assist;To bed Details for Transfer Assistance: VCs safety, technique, hand placement. Assist to rise, stabilize, control descent.  Ambulation/Gait Ambulation/Gait Assistance: 4: Min assist Ambulation Distance (Feet): 100 Feet Assistive device: Rolling walker Ambulation/Gait Assistance Details: VCS safety. Assist to stabilize throughout ambulation. Fatigues fairly easily.  Gait Pattern: Trunk flexed;Decreased stride length;Step-through pattern    Exercises     PT Diagnosis: Difficulty walking;Generalized weakness  PT Problem List: Decreased  strength;Decreased mobility;Decreased balance;Decreased cognition;Decreased knowledge of use of DME;Decreased activity tolerance PT Treatment Interventions: DME instruction;Gait training;Functional mobility training;Therapeutic activities;Therapeutic exercise;Balance training;Patient/family education   PT Goals Acute Rehab PT Goals PT Goal Formulation: With family Time For Goal Achievement: 12/07/12 Potential to Achieve Goals: Fair Pt will go Supine/Side to Sit: with min assist PT Goal: Supine/Side to Sit - Progress: Goal set today Pt will go Sit to Supine/Side: with min assist PT Goal: Sit to Supine/Side - Progress: Goal set today Pt will go Sit to Stand: with min assist PT Goal: Sit to Stand - Progress: Goal set today Pt will Ambulate: 51 - 150 feet;with min assist;with rolling walker PT Goal: Ambulate - Progress: Goal set today  Visit Information  Last PT Received On: 11/23/12 Assistance Needed: +1    Subjective Data  Subjective: Im fine with that (home) Patient Stated Goal: home   Prior Functioning  Home Living Lives With: Alone Available Help at Discharge: Personal care attendant (24 hour) Type of Home: House Home Access: Level entry Home Layout: One level Home Adaptive Equipment: Shower chair with back;Wheelchair - manual;Walker - rolling;Bedside commode/3-in-1 Prior Function Level of Independence: Needs assistance Needs Assistance: Bathing;Dressing;Meal Prep;Light Housekeeping;Gait;Transfers Bath: Total Dressing: Total Meal Prep: Total Light Housekeeping: Total Gait Assistance: Min A with walker Transfer Assistance: Min A Able to Take Stairs?: No Driving: No Communication Communication: No difficulties    Cognition  Cognition Arousal/Alertness: Awake/alert Behavior During Therapy: WFL for tasks assessed/performed Overall Cognitive Status: History of cognitive impairments - at baseline    Extremity/Trunk Assessment Right Lower Extremity Assessment RLE  ROM/Strength/Tone: Deficits RLE ROM/Strength/Tone Deficits: Strength at least 4/5 with  functional mobility Left Lower Extremity Assessment LLE ROM/Strength/Tone: Deficits LLE ROM/Strength/Tone Deficits: Strength at least 4/5 with functional mobility Trunk Assessment Trunk Assessment: Normal   Balance Balance Balance Assessed: Yes Static Standing Balance Static Standing - Balance Support: Bilateral upper extremity supported Static Standing - Level of Assistance: 4: Min assist Dynamic Standing Balance Dynamic Standing - Balance Support: Bilateral upper extremity supported;During functional activity Dynamic Standing - Level of Assistance: 4: Min assist  End of Session PT - End of Session Equipment Utilized During Treatment: Gait belt Activity Tolerance: Patient tolerated treatment well Patient left: in bed;with call bell/phone within reach;with family/visitor present  GP     [name], MPT Pager: [phone]

## 2012-11-23 NOTE — Progress Notes (Signed)
Speech Language Pathology Dysphagia Treatment Patient Details Name: [name] MRN: [id number] DOB: Sep 07, 1920 Today's Date: 11/23/2012 Time: 4098-1191 SLP Time Calculation (min): 19 min  Assessment / Plan / Recommendation Clinical Impression  Skilled SLP treatment to educate family to findings of clinical swallow evaluation, general aspiration precautions and compensatory strategies for her GERD/pill dysphagia.  Pt's son reports pt had h/o chronic cough which her primary care MD diagnosed as GERD and she took a PPI with resolution of symptoms except an occasional "tickle" not associated to po intake.  Pt has been on a PPI for 1 1/2 years 20 mg.  Intake listed as 100% and pt denies difficulties swallowing and finds pleasure in stating, "I can always eat'.  Per family wincing yesterday was associated with pt displeasure of food.  Provided pt with sample "Magic Cup" vanilla flavored for her to try to see if may desire to use in the future if nutrition becomes an issue.  No further SLP indicated as family denies concerns [id number] and all education is complete.  Thanks.     Diet Recommendation  Continue with Current Diet: Regular;Thin liquid    SLP Plan     Pertinent Vitals/Pain Afebrile, decreased, clear   Swallowing Goals  SLP Swallowing Goals Swallow Study Goal #3 - Progress: Met  General Temperature Spikes Noted: No Respiratory Status: Room air Behavior/Cognition: Alert;Cooperative;Pleasant mood;Hard of hearing Oral Cavity - Dentition: Adequate natural dentition Patient Positioning: Upright in bed  Oral Cavity - Oral Hygiene   Adequate  Dysphagia Treatment Treatment focused on: Patient/family/caregiver education Family/Caregiver Educated: son Jonny Ruiz, daughter Thurston Hole Treatment Methods/Modalities: Skilled observation Patient observed directly with PO's: Yes Type of PO's observed:  (magic cup bolus) Feeding: Able to feed self   GO     Donavan Burnet, MS [facility]  SLP [phone]

## 2012-11-23 NOTE — Discharge Summary (Signed)
Physician Discharge Summary  [name] WUJ:[id number] DOB: 24-Mar-1921 DOA: 11/21/2012  PCP: Almedia Balls, MD  Admit date: 11/21/2012 Discharge date: 11/23/2012  Time spent: 50 minutes  Recommendations for Outpatient Follow-up:  1. Follow up PCP in 1-2 weeks  Discharge Diagnoses:  Principal Problem:   Abdominal  pain, other specified site Active Problems:   Hypothyroidism   Dementia   Acute renal failure   CKD (chronic kidney disease), stage III   Hypokalemia   Anemia due to chronic illness   Leukocytosis, unspecified   Discharge Condition: Stable  Diet recommendation: low salt diet  Filed Weights   11/21/12 2000  Weight: 64 kg (141 lb 1.5 oz)    History of present illness:  79 y.o. female with PMH significant for dementia, hypothyroidism, CKD stage III, recently treated for klebsiella with keflex, who presented to [facility] ED with main concern of progressively worsening generalized abdominal pain that started several days prior to admission, intermittent and dull, 5/10 in severity when present and associated with 4-5 days history of constipation, nausea and poor oral intake. In ED, she was noted to have fecal impaction and was disimpacted, CT abdomen and pelvis with diverticulosis and ? diverticulitis. Pt started on Cipro and Flagyl   Hospital Course:  1. Abdominal pain- resolved, likely multifactorial due to constipation and diverticulitis. Patient is tolerating the by mouth diet well we'll continue empiric antibiotics Cipro and Flagyl. 2. Constipation-resolved patient was disimpacted in the ED. Continue with Colace. We'll also start MiraLAX when necessary for constipation. 3. Hypothyroidism-continue Synthroid 4. Dementia-stable 5. Acute kidney injury-creatinine is 1.70 today. She does have C. KD stage III 6. Anemia-patient hemoglobin dropped from 11.7 -9.5, likely due to dilutional effect.  7. Deconditioning- No new recommendations by physical  therapy.   Procedures:  none  Consultations:  None   Filed Vitals:   11/22/12 0711 11/22/12 1424 11/22/12 2100 11/23/12 1416  BP: 125/41 102/73 117/65 119/46  Pulse: 51 58 74 57  Temp: 98.2 F (36.8 C) 98 F (36.7 C) 98.2 F (36.8 C) 97.3 F (36.3 C)  TempSrc: Oral  Oral Oral  Resp: 16 18 16 16   Height:      Weight:      SpO2: 99% 99% 98% 100%    Discharge Instructions  Discharge Orders   Future Appointments Provider Department Dept Phone   12/01/2012 1:45 PM Bennye Alm, MD [facility] [phone]   Future Orders Complete By Expires     Diet - low sodium heart healthy  As directed     Increase activity slowly  As directed         Medication List    STOP taking these medications       cephALEXin 500 MG capsule  Commonly known as:  KEFLEX      TAKE these medications       acetaminophen 325 MG tablet  Commonly known as:  TYLENOL  Take 650 mg by mouth every 6 (six) hours as needed for pain (headache).     aspirin EC 81 MG tablet  Take 81 mg by mouth every morning.     atorvastatin 20 MG tablet  Commonly known as:  LIPITOR  Take 20 mg by mouth every evening.     calcium-vitamin D 500-200 MG-UNIT per tablet  Commonly known as:  OSCAL WITH D  Take 1 tablet by mouth 2 (two) times daily.     chlorthalidone 25 MG tablet  Commonly known as:  HYGROTON  Take  25 mg by mouth every morning.     ciprofloxacin 500 MG tablet  Commonly known as:  CIPRO  Take 1 tablet (500 mg total) by mouth daily before breakfast.  Start taking on:  11/24/2012     donepezil 10 MG tablet  Commonly known as:  ARICEPT  Take 10 mg by mouth every evening.     levothyroxine 100 MCG tablet  Commonly known as:  SYNTHROID, LEVOTHROID  Take 100 mcg by mouth daily before breakfast.     memantine 10 MG tablet  Commonly known as:  NAMENDA  Take 10 mg by mouth 2 (two) times daily.     metroNIDAZOLE 500 MG tablet  Commonly known as:  FLAGYL  Take 1 tablet  (500 mg total) by mouth 3 (three) times daily.     omeprazole 20 MG capsule  Commonly known as:  PRILOSEC  Take 20 mg by mouth every evening.     ondansetron 8 MG disintegrating tablet  Commonly known as:  ZOFRAN ODT  Take 1 tablet (8 mg total) by mouth every 8 (eight) hours as needed for nausea.       Allergies  Allergen Reactions  . Ciprocin-Fluocin-Procin (Fluocinolone) Rash    Reaction on arm IV  . Penicillins Other (See Comments)    Reaction unknown   . Tramadol Hcl Nausea And Vomiting  . Dilaudid (Hydromorphone Hcl) Nausea And Vomiting       Follow-up Information   Follow up with [facility] MD In 2 weeks.   Contact information:   [address] [place] [address] [phone]        The results of significant diagnostics from this hospitalization (including imaging, microbiology, ancillary and laboratory) are listed below for reference.    Significant Diagnostic Studies: Ct Abdomen Pelvis Wo Contrast  11/21/2012   *RADIOLOGY REPORT*  Clinical Data: Abdominal pain for 3 days.  History of dementia and constipation.  CT ABDOMEN AND PELVIS WITHOUT CONTRAST  Technique:  Multidetector CT imaging of the abdomen and pelvis was performed following the standard protocol without intravenous contrast.  Comparison: Acute abdominal series 11/21/2012.  Pelvic CT 12/01/2011.  Findings: The lung bases are clear.  There is no pleural or pericardial effusion.  A small hiatal hernia and mild aortic atherosclerosis are noted.  There are well-circumscribed low density hepatic lesions which are likely cysts.  The largest measure 1.3 cm on image number 11 and 1.0 cm on image 15.  No suspicious liver lesions are identified. The spleen, gallbladder, pancreas and adrenal glands appear normal. Both kidneys demonstrate mild cortical thinning.  There is a 6 mm exophytic lesion projecting posteriorly from the mid right kidney on image 29.  There is diffuse aorto iliac atherosclerosis.  No enlarged  abdominal pelvic lymph nodes are identified.  The stomach and small bowel appear normal aside from a proximal duodenal diverticulum. There is extensive diverticulosis of the sigmoid colon with associated mural thickening and possible mild adjacent inflammation.  No pericolonic abscess or perforation is identified. The appendix appears normal.  Vaginal pessary is noted.  The bladder, uterus and adnexa appear unremarkable. Old pelvic fractures and lumbar spondylosis are noted.  There are no worrisome osseous findings.  IMPRESSION:  1.  Extensive sigmoid diverticulosis with possible associated mild acute inflammation.  No evidence of perforation, abscess or bowel obstruction. 2.  No other acute findings identified. 3.  Extensive atherosclerosis. 4.  Low density liver lesions, likely incidental cysts.   Original Report Authenticated By: Carey Bullocks, M.D.  Dg Abd Acute W/chest  11/21/2012   *RADIOLOGY REPORT*  Clinical Data: Abdominal pain with generalized weakness. Constipation.  ACUTE ABDOMEN SERIES (ABDOMEN 2 VIEW & CHEST 1 VIEW)  Comparison: Acute abdominal series 11/11/2012.  Pelvic CT 12/01/2011.  Findings: The heart size and mediastinal contours are stable with aortic atherosclerosis.  The lungs are clear.  There is no pleural effusion or pneumothorax.  The bowel gas pattern is nonobstructive.  There is moderate stool within the rectum.  There is no evidence of free intraperitoneal air or suspicious abdominal calcification.  Scattered vascular calcifications are noted.  IMPRESSION: No acute cardiopulmonary or abdominal process demonstrated.  Mildly prominent stool in the rectum.   Original Report Authenticated By: Carey Bullocks, M.D.   Dg Abd Acute W/chest  11/11/2012   *RADIOLOGY REPORT*  Clinical Data: Nausea, vomiting and diarrhea.  ACUTE ABDOMEN SERIES (ABDOMEN 2 VIEW & CHEST 1 VIEW)  Comparison: Abdominal film on 08/27/2011  Findings: The lungs are clear and show no edema or infiltrate. Heart size  is normal.  Abdominal films show no evidence of bowel obstruction or free intraperitoneal air.  No significant ileus is identified.  Degenerative changes are present throughout the lumbar spine.  Aorto-iliac atherosclerotic calcifications are present.  IMPRESSION: No acute findings.   Original Report Authenticated By: Irish Lack, M.D.    Microbiology: No results found for this or any previous visit (from the past 240 hour(s)).   Labs: Basic Metabolic Panel:  Recent Labs Lab 11/21/12 1050 11/22/12 0433 11/23/12 0345  NA 138 134* 138  K 3.3* 3.1* 4.4  CL 98 100 108  CO2 28 27 23   GLUCOSE 118* 87 88  BUN 24* 20 22  CREATININE 1.56* 1.47* 1.70*  CALCIUM 9.6 8.6 8.5   Liver Function Tests:  Recent Labs Lab 11/21/12 1050  AST 23  ALT 18  ALKPHOS 25*  BILITOT 0.5  PROT 7.0  ALBUMIN 3.3*    Recent Labs Lab 11/21/12 1050  LIPASE 41   No results found for this basename: AMMONIA,  in the last 168 hours CBC:  Recent Labs Lab 11/21/12 1050 11/22/12 0433 11/23/12 0345  WBC 12.4* 8.3 6.5  NEUTROABS 10.7*  --   --   HGB 11.7* 9.5* 9.4*  HCT 34.7* 28.9* 28.6*  MCV 89.7 89.5 90.5  PLT 280 194 202   Cardiac Enzymes: No results found for this basename: CKTOTAL, CKMB, CKMBINDEX, TROPONINI,  in the last 168 hours BNP: BNP (last 3 results) No results found for this basename: PROBNP,  in the last 8760 hours CBG: No results found for this basename: GLUCAP,  in the last 168 hours     Signed:  Chesnee Floren S  [facility] 11/23/2012, 3:48 PM

## 2012-11-23 NOTE — Progress Notes (Signed)
[facility] PROGRESS NOTE  [name] ZOX:[id number] DOB: 05-Dec-1920 DOA: 11/21/2012 PCP: Almedia Balls, MD  Brief narrative: 79 y.o. female with PMH significant for dementia, hypothyroidism, CKD stage III, recently treated for klebsiella with keflex, who presented to [facility] ED with main concern of progressively worsening generalized abdominal pain that started several days prior to admission, intermittent and dull, 5/10 in severity when present and associated with 4-5 days history of constipation, nausea and poor oral intake. In ED, she was noted to have fecal impaction and was disimpacted, CT abdomen and pelvis with diverticulosis and ? diverticulitis. Pt started on Cipro and Flagyl   Assessment/Plan: 1. Abdominal pain- resolved, likely multifactorial due to constipation and diverticulitis. Patient is tolerating the by mouth diet well we'll continue empiric antibiotics Cipro and Flagyl. 2. Constipation-resolved patient was disimpacted in the ED. Continue with Colace. We'll also start MiraLAX when necessary for constipation. 3. Hypothyroidism-continue Synthroid 4.  Dementia-stable 5. Acute kidney injury-creatinine is 1.70 today. She does have C. KD stage III 6. Anemia-patient hemoglobin dropped from 11.7 -9.5, likely due to dilutional effect. We'll follow CBC in a.m. 7. Deconditioning- We'll get physical therapy consult 8. DVT prophylaxis-Lovenox  Code Status: DNR Family Communication: Discussed with patient's family at bedside, including daughter and son in law Disposition Plan: Home when stable   Consultants:  None   Procedures/Studies:  Ct Abdomen Pelvis Wo Contrast 11/21/2012  1. Extensive sigmoid diverticulosis with possible associated mild acute inflammation. No evidence of perforation, abscess or bowel obstruction.  2. No other acute findings identified.  3. Extensive atherosclerosis.  4. Low density liver lesions, likely incidental cysts.  Dg Abd Acute W/chest 11/21/2012   No acute cardiopulmonary or abdominal process demonstrated. Mildly prominent stool in the rectum.  Antibiotics:  Cipro 6/16 -->  Flagyl 6/16 -->     HPI/Subjective: Patient seen and examined, feels better. No nausea, vomiting today. Tolerating po diet.Has passed swallow evaluation.  Objective: Filed Vitals:   11/21/12 2000 11/22/12 0711 11/22/12 1424 11/22/12 2100  BP: 120/64 125/41 102/73 117/65  Pulse: 68 51 58 74  Temp:  98.2 F (36.8 C) 98 F (36.7 C) 98.2 F (36.8 C)  TempSrc: Oral Oral  Oral  Resp: 16 16 18 16   Height: 5\' 4"  (1.626 m)     Weight: 64 kg (141 lb 1.5 oz)     SpO2: 100% 99% 99% 98%    Intake/Output Summary (Last 24 hours) at 11/23/12 1144 Last data filed at 11/23/12 0400  Gross per 24 hour  Intake   1000 ml  Output    400 ml  Net    600 ml   Filed Weights   11/21/12 2000  Weight: 64 kg (141 lb 1.5 oz)    Exam:   General:  Appear in no acute distress  Cardiovascular: s1s2 RRR  Respiratory: Clear bilaterally  Abdomen: Soft, non tender, no organomegaly  Musculoskeletal: No edema of lower extremities  Data Reviewed: Basic Metabolic Panel:  Recent Labs Lab 11/21/12 1050 11/22/12 0433 11/23/12 0345  NA 138 134* 138  K 3.3* 3.1* 4.4  CL 98 100 108  CO2 28 27 23   GLUCOSE 118* 87 88  BUN 24* 20 22  CREATININE 1.56* 1.47* 1.70*  CALCIUM 9.6 8.6 8.5   Liver Function Tests:  Recent Labs Lab 11/21/12 1050  AST 23  ALT 18  ALKPHOS 25*  BILITOT 0.5  PROT 7.0  ALBUMIN 3.3*    Recent Labs Lab 11/21/12 1050  LIPASE 41  No results found for this basename: AMMONIA,  in the last 168 hours CBC:  Recent Labs Lab 11/21/12 1050 11/22/12 0433 11/23/12 0345  WBC 12.4* 8.3 6.5  NEUTROABS 10.7*  --   --   HGB 11.7* 9.5* 9.4*  HCT 34.7* 28.9* 28.6*  MCV 89.7 89.5 90.5  PLT 280 194 202   Cardiac Enzymes: No results found for this basename: CKTOTAL, CKMB, CKMBINDEX, TROPONINI,  in the last 168 hours BNP (last 3 results) No  results found for this basename: PROBNP,  in the last 8760 hours CBG: No results found for this basename: GLUCAP,  in the last 168 hours  No results found for this or any previous visit (from the past 240 hour(s)).   Studies: Ct Abdomen Pelvis Wo Contrast  11/21/2012   *RADIOLOGY REPORT*  Clinical Data: Abdominal pain for 3 days.  History of dementia and constipation.  CT ABDOMEN AND PELVIS WITHOUT CONTRAST  Technique:  Multidetector CT imaging of the abdomen and pelvis was performed following the standard protocol without intravenous contrast.  Comparison: Acute abdominal series 11/21/2012.  Pelvic CT 12/01/2011.  Findings: The lung bases are clear.  There is no pleural or pericardial effusion.  A small hiatal hernia and mild aortic atherosclerosis are noted.  There are well-circumscribed low density hepatic lesions which are likely cysts.  The largest measure 1.3 cm on image number 11 and 1.0 cm on image 15.  No suspicious liver lesions are identified. The spleen, gallbladder, pancreas and adrenal glands appear normal. Both kidneys demonstrate mild cortical thinning.  There is a 6 mm exophytic lesion projecting posteriorly from the mid right kidney on image 29.  There is diffuse aorto iliac atherosclerosis.  No enlarged abdominal pelvic lymph nodes are identified.  The stomach and small bowel appear normal aside from a proximal duodenal diverticulum. There is extensive diverticulosis of the sigmoid colon with associated mural thickening and possible mild adjacent inflammation.  No pericolonic abscess or perforation is identified. The appendix appears normal.  Vaginal pessary is noted.  The bladder, uterus and adnexa appear unremarkable. Old pelvic fractures and lumbar spondylosis are noted.  There are no worrisome osseous findings.  IMPRESSION:  1.  Extensive sigmoid diverticulosis with possible associated mild acute inflammation.  No evidence of perforation, abscess or bowel obstruction. 2.  No other  acute findings identified. 3.  Extensive atherosclerosis. 4.  Low density liver lesions, likely incidental cysts.   Original Report Authenticated By: Carey Bullocks, M.D.    Scheduled Meds: . aspirin EC  81 mg Oral q morning - 10a  . atorvastatin  20 mg Oral QPM  . calcium carbonate  400 mg of elemental calcium Oral BID   And  . cholecalciferol  400 Units Oral Daily  . chlorthalidone  25 mg Oral q morning - 10a  . ciprofloxacin  500 mg Oral QAC breakfast  . docusate sodium  100 mg Oral BID  . donepezil  10 mg Oral QPM  . enoxaparin (LOVENOX) injection  30 mg Subcutaneous Q24H  . levothyroxine  100 mcg Oral QAC breakfast  . memantine  10 mg Oral BID  . metronidazole  500 mg Intravenous Q8H  . pantoprazole  40 mg Oral Daily   Continuous Infusions: . sodium chloride 50 mL/hr at 11/23/12 1191    Principal Problem:   Abdominal  pain, other specified site Active Problems:   Hypothyroidism   Dementia   Acute renal failure   CKD (chronic kidney disease), stage III   Hypokalemia  Anemia due to chronic illness   Leukocytosis, unspecified    Time spent: 35 min    [facility]  [facility] Pager [phone]. If 7PM-7AM, please contact night-coverage at [email or web address], password [facility] 11/23/2012, 11:44 AM  LOS: 2 days

## 2012-11-23 NOTE — Progress Notes (Signed)
Pt. Combative and aggitated this AM, refusing vitals. Pt. Educated on need for routine vitals, still refused stating "they hurt".

## 2012-11-23 NOTE — Progress Notes (Signed)
Patient discharged home, all discharge medications and instructions reviewed and questions answered. Patient to be assisted to vehicle by wheelchair.  

## 2012-12-01 ENCOUNTER — Ambulatory Visit: Payer: Medicare Other | Admitting: Gynecology

## 2012-12-02 ENCOUNTER — Other Ambulatory Visit: Payer: Self-pay | Admitting: Internal Medicine

## 2012-12-02 NOTE — Telephone Encounter (Signed)
Pt admitted to hospital June 16 with multiple problems. Please call son and see if he thinks keeping her on Lipitor is reasonable at this point.

## 2012-12-06 NOTE — Telephone Encounter (Signed)
Left message for son, Hedi Barkan to call us back and let us know what he decides.

## 2012-12-13 ENCOUNTER — Encounter (HOSPITAL_COMMUNITY): Payer: Self-pay | Admitting: Emergency Medicine

## 2012-12-13 ENCOUNTER — Emergency Department (HOSPITAL_COMMUNITY)
Admission: EM | Admit: 2012-12-13 | Discharge: 2012-12-13 | Disposition: A | Discharge status: Home / Self Care | Payer: Medicare Other | Attending: Emergency Medicine | Admitting: Emergency Medicine

## 2012-12-13 ENCOUNTER — Emergency Department (HOSPITAL_COMMUNITY): Payer: Medicare Other

## 2012-12-13 DIAGNOSIS — K219 Gastro-esophageal reflux disease without esophagitis: Secondary | ICD-10-CM | POA: Insufficient documentation

## 2012-12-13 DIAGNOSIS — F0390 Unspecified dementia without behavioral disturbance: Secondary | ICD-10-CM | POA: Insufficient documentation

## 2012-12-13 DIAGNOSIS — Z87891 Personal history of nicotine dependence: Secondary | ICD-10-CM | POA: Insufficient documentation

## 2012-12-13 DIAGNOSIS — Z87448 Personal history of other diseases of urinary system: Secondary | ICD-10-CM | POA: Insufficient documentation

## 2012-12-13 DIAGNOSIS — E785 Hyperlipidemia, unspecified: Secondary | ICD-10-CM | POA: Insufficient documentation

## 2012-12-13 DIAGNOSIS — Z7982 Long term (current) use of aspirin: Secondary | ICD-10-CM | POA: Insufficient documentation

## 2012-12-13 DIAGNOSIS — I10 Essential (primary) hypertension: Secondary | ICD-10-CM | POA: Insufficient documentation

## 2012-12-13 DIAGNOSIS — K5900 Constipation, unspecified: Secondary | ICD-10-CM | POA: Insufficient documentation

## 2012-12-13 DIAGNOSIS — Z860100 Personal history of colon polyps, unspecified: Secondary | ICD-10-CM | POA: Insufficient documentation

## 2012-12-13 DIAGNOSIS — Z8673 Personal history of transient ischemic attack (TIA), and cerebral infarction without residual deficits: Secondary | ICD-10-CM | POA: Insufficient documentation

## 2012-12-13 DIAGNOSIS — Z880 Allergy status to penicillin: Secondary | ICD-10-CM | POA: Insufficient documentation

## 2012-12-13 DIAGNOSIS — K5641 Fecal impaction: Secondary | ICD-10-CM | POA: Insufficient documentation

## 2012-12-13 DIAGNOSIS — Z8739 Personal history of other diseases of the musculoskeletal system and connective tissue: Secondary | ICD-10-CM | POA: Insufficient documentation

## 2012-12-13 DIAGNOSIS — Z79899 Other long term (current) drug therapy: Secondary | ICD-10-CM | POA: Insufficient documentation

## 2012-12-13 DIAGNOSIS — E039 Hypothyroidism, unspecified: Secondary | ICD-10-CM | POA: Insufficient documentation

## 2012-12-13 LAB — CBC WITH DIFFERENTIAL/PLATELET
Basophils Absolute: 0 10*3/uL (ref 0.0–0.1)
Eosinophils Relative: 0 % (ref 0–5)
Lymphocytes Relative: 11 % — ABNORMAL LOW (ref 12–46)
MCV: 91 fL (ref 78.0–100.0)
Neutro Abs: 10.3 10*3/uL — ABNORMAL HIGH (ref 1.7–7.7)
Platelets: 283 10*3/uL (ref 150–400)
RDW: 14.9 % (ref 11.5–15.5)
WBC: 12.4 10*3/uL — ABNORMAL HIGH (ref 4.0–10.5)

## 2012-12-13 LAB — COMPREHENSIVE METABOLIC PANEL
ALT: 15 U/L (ref 0–35)
AST: 23 U/L (ref 0–37)
CO2: 22 mEq/L (ref 19–32)
Calcium: 10.6 mg/dL — ABNORMAL HIGH (ref 8.4–10.5)
GFR calc non Af Amer: 27 mL/min — ABNORMAL LOW (ref >90)
Sodium: 136 mEq/L (ref 135–145)
Total Protein: 8 g/dL (ref 6.0–8.3)

## 2012-12-13 MED ORDER — FLEET ENEMA 7-19 GM/118ML RE ENEM
1.0000 | ENEMA | Freq: Once | RECTAL | Status: AC
  Administered 2012-12-13: 1 via RECTAL
  Filled 2012-12-13: qty 1

## 2012-12-13 NOTE — ED Notes (Signed)
Pt from home with c/o of constipation for the past couple of days, pain with bowel movement. Discharge from hospital couple of weeks ago with diverticulitis. Pt had large loose bowel movement in car. NAD at this time.

## 2012-12-13 NOTE — ED Provider Notes (Signed)
History    CSN: [id number] Arrival date & time 12/13/12  1754  First MD Initiated Contact with Patient 12/13/12 1802     Chief Complaint  Patient presents with  . Constipation   (Consider location/radiation/quality/duration/timing/severity/associated sxs/prior Treatment) HPI Comments: Patient with history of dementia.  Lives at home with 24 hour care.  She was recently admitted for diverticulitis, constipation.  Has not had a BM in the past couple of days and is now having rectal pain.  She had a large bowel en route to the ER and is now feeling somewhat better.    Patient is a 79 y.o. female presenting with constipation. The history is provided by the patient, a caregiver and a relative (son at bedside).  Constipation Severity:  Moderate Time since last bowel movement:  2 days Timing:  Constant Progression:  Worsening Chronicity:  New Stool description:  None produced Relieved by:  Nothing Worsened by:  Nothing tried Ineffective treatments:  None tried  Past Medical History  Diagnosis Date  . OP (osteoporosis)   . Adenomatous polyps   . Hearing loss   . Hyperlipidemia   . Urge urinary incontinence   . Hypothyroidism   . Cervical prolapse   . Renal insufficiency   . PONV (postoperative nausea and vomiting)   . Hypertension   . GERD (gastroesophageal reflux disease)   . TIA (transient ischemic attack)     "multiple"  . Dementia     "really can't remember anything"  . Complete uterine prolapse   . Dementia    Past Surgical History  Procedure Laterality Date  . Wrist fracture surgery  2008    S/P fall; ?right  . Cataract extraction w/ intraocular lens  implant, bilateral  1990's   Family History  Problem Relation Age of Onset  . Osteoporosis Mother   . Dementia Father    History  Substance Use Topics  . Smoking status: Former Smoker -- 0.50 packs/day for 1 years    Types: Cigarettes    Quit date: 06/08/1942  . Smokeless tobacco: Never Used  . Alcohol Use:  Yes     Comment: "last alcohol was before 1994"   OB History   Grav Para Term Preterm Abortions TAB SAB Ect Mult Living   5 4 4  1     4      Review of Systems  Gastrointestinal: Positive for constipation.  All other systems reviewed and are negative.    Allergies  Ciprocin-fluocin-procin; Penicillins; Tramadol hcl; and Dilaudid  Home Medications   Current Outpatient Rx  Name  Route  Sig  Dispense  Refill  . aspirin EC 81 MG tablet   Oral   Take 81 mg by mouth every morning.         Marland Kitchen atorvastatin (LIPITOR) 20 MG tablet   Oral   Take 20 mg by mouth every evening.         . calcium-vitamin D (OSCAL WITH D) 500-200 MG-UNIT per tablet   Oral   Take 1 tablet by mouth 2 (two) times daily.           . chlorthalidone (HYGROTON) 25 MG tablet   Oral   Take 25 mg by mouth every morning.         . donepezil (ARICEPT) 10 MG tablet   Oral   Take 10 mg by mouth every evening.         Marland Kitchen levothyroxine (SYNTHROID, LEVOTHROID) 100 MCG tablet   Oral   Take  100 mcg by mouth daily before breakfast.         . memantine (NAMENDA) 10 MG tablet   Oral   Take 10 mg by mouth 2 (two) times daily.         Marland Kitchen omeprazole (PRILOSEC) 20 MG capsule   Oral   Take 20 mg by mouth every evening.          BP 141/45  Pulse 90  Temp(Src) 98.1 F (36.7 C) (Oral)  Resp 16  SpO2 99% Physical Exam  Nursing note and vitals reviewed. Constitutional: She is oriented to person, place, and time. She appears well-developed and well-nourished. No distress.  HENT:  Head: Normocephalic and atraumatic.  Neck: Normal range of motion. Neck supple.  Cardiovascular: Normal rate and regular rhythm.  Exam reveals no gallop and no friction rub.   No murmur heard. Pulmonary/Chest: Effort normal and breath sounds normal. No respiratory distress. She has no wheezes.  Abdominal: Soft. Bowel sounds are normal. She exhibits no distension. There is no tenderness.  Genitourinary:  There is brown stool  in the rectum and what appears to be potentially a fecal impaction.  Musculoskeletal: Normal range of motion.  Neurological: She is alert and oriented to person, place, and time.  Skin: Skin is warm and dry. She is not diaphoretic.    ED Course  Procedures (including critical care time) Labs Reviewed  CBC WITH DIFFERENTIAL  COMPREHENSIVE METABOLIC PANEL   No results found. No diagnosis found.  MDM  The history, exam, and workup is consistent with constipation.  She was disimpacted of a large amount of stool and having a large bowel movement prior to arrival.  Will recommend mag citrate, followed by maintenance colace and milk of magnesia.    Geoffery Lyons, MD 12/13/12 2018

## 2012-12-23 ENCOUNTER — Ambulatory Visit (INDEPENDENT_AMBULATORY_CARE_PROVIDER_SITE_OTHER): Payer: Medicare Other | Admitting: Gynecology

## 2012-12-23 VITALS — BP 114/58 | HR 62 | Resp 12

## 2012-12-23 DIAGNOSIS — N813 Complete uterovaginal prolapse: Secondary | ICD-10-CM

## 2012-12-23 MED ORDER — ESTRADIOL 2 MG VA RING: 2.0000 mg | VAGINAL_RING | VAGINAL | Status: AC

## 2012-12-23 NOTE — Progress Notes (Signed)
Pt brought in by her daughter in law and aide for pessary check and cleaning.  Pt had been cared for by her daughter who died suddenly 73m ago and now she is being cared for by her daughter in law.  Daughter in law is requesting that the Gelhorn be removed and not replaced today.  She states that it has been an issue to place the estrace cream, it causes the patient emotional stress as she suffers from dementia and cannot understand what is happening  and therefore it has not been done with any regularity.  Initially when the pessary was placed, the pt would try to remove it and tried to cut it out?  She no longer is complaining about it. We reviewed the office chart.  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  We discussed the impact on both the bladder and ureters, the risk of recurrent UTI and urosepsis.  Due to the dementia, she would be a poor candidate for surgery but a LeFortes might be an option. I offered placing an estring behind the pessary so additional estrogen would not be needed and both could be changed toegether, the daughter in law was in agreement that this might be the best option. We will have them return to office with the estring so it can be placed at the same time as the pessary Length of time 27m discussing above

## 2012-12-30 ENCOUNTER — Ambulatory Visit (INDEPENDENT_AMBULATORY_CARE_PROVIDER_SITE_OTHER): Payer: Medicare Other | Admitting: Gynecology

## 2012-12-30 VITALS — BP 126/78 | HR 78 | Resp 14 | Ht 63.5 in

## 2012-12-30 DIAGNOSIS — N813 Complete uterovaginal prolapse: Secondary | ICD-10-CM

## 2012-12-30 NOTE — Progress Notes (Signed)
79 y.o. SingleWhite female [id number] here for pessary check.  Patient has been using following pessary style and size:  2.5 short stem gelhorn.  She is not sexually active.  She describes the following issues with the pessary:  New onset of constipation.  ROS: normal menses, no abnormal bleeding, pelvic pain or discharge, no dysuria, trouble voiding or hematuria  Exam:   BP 126/78  Pulse 78  Resp 14  Ht 5' 3.5" (1.613 m) General appearance: fatigued and slowed mentation Inguinal adenopathy: negative   Pelvic: External genitalia:  atrophic appearance              Urethra: stenotic              Bartholins and Skenes: normal                 Vagina: atrophic, no excoriations              Cervix: atrophic Bimanual Exam:  Uterus:  atrophic                               Adnexa:    Not palpable                               Anus:  deferred  Pessary was removed with difficulty. Pt combative.  Pessary was cleansed.  Pessary was replaced, estring placed behind pessary. Patient tolerated procedure well.    A: complete procidentia        P:  Return to office in 3 months for recheck.         An After Visit Summary was printed and given to the patient.

## 2013-01-21 ENCOUNTER — Encounter (HOSPITAL_COMMUNITY): Payer: Self-pay | Admitting: Emergency Medicine

## 2013-01-21 ENCOUNTER — Emergency Department (HOSPITAL_COMMUNITY)
Admission: EM | Admit: 2013-01-21 | Discharge: 2013-01-21 | Disposition: A | Discharge status: Home / Self Care | Payer: Medicare Other | Attending: Emergency Medicine | Admitting: Emergency Medicine

## 2013-01-21 ENCOUNTER — Emergency Department (HOSPITAL_COMMUNITY): Payer: Medicare Other

## 2013-01-21 DIAGNOSIS — E785 Hyperlipidemia, unspecified: Secondary | ICD-10-CM | POA: Insufficient documentation

## 2013-01-21 DIAGNOSIS — Z860100 Personal history of colon polyps, unspecified: Secondary | ICD-10-CM | POA: Insufficient documentation

## 2013-01-21 DIAGNOSIS — I10 Essential (primary) hypertension: Secondary | ICD-10-CM | POA: Insufficient documentation

## 2013-01-21 DIAGNOSIS — F0390 Unspecified dementia without behavioral disturbance: Secondary | ICD-10-CM | POA: Insufficient documentation

## 2013-01-21 DIAGNOSIS — E039 Hypothyroidism, unspecified: Secondary | ICD-10-CM | POA: Insufficient documentation

## 2013-01-21 DIAGNOSIS — M549 Dorsalgia, unspecified: Secondary | ICD-10-CM | POA: Insufficient documentation

## 2013-01-21 DIAGNOSIS — R109 Unspecified abdominal pain: Secondary | ICD-10-CM | POA: Insufficient documentation

## 2013-01-21 DIAGNOSIS — IMO0002 Reserved for concepts with insufficient information to code with codable children: Secondary | ICD-10-CM | POA: Insufficient documentation

## 2013-01-21 DIAGNOSIS — Z87891 Personal history of nicotine dependence: Secondary | ICD-10-CM | POA: Insufficient documentation

## 2013-01-21 DIAGNOSIS — Z8742 Personal history of other diseases of the female genital tract: Secondary | ICD-10-CM | POA: Insufficient documentation

## 2013-01-21 DIAGNOSIS — M810 Age-related osteoporosis without current pathological fracture: Secondary | ICD-10-CM | POA: Insufficient documentation

## 2013-01-21 DIAGNOSIS — Z8673 Personal history of transient ischemic attack (TIA), and cerebral infarction without residual deficits: Secondary | ICD-10-CM | POA: Insufficient documentation

## 2013-01-21 DIAGNOSIS — Z7982 Long term (current) use of aspirin: Secondary | ICD-10-CM | POA: Insufficient documentation

## 2013-01-21 DIAGNOSIS — Z87448 Personal history of other diseases of urinary system: Secondary | ICD-10-CM | POA: Insufficient documentation

## 2013-01-21 DIAGNOSIS — K219 Gastro-esophageal reflux disease without esophagitis: Secondary | ICD-10-CM | POA: Insufficient documentation

## 2013-01-21 DIAGNOSIS — Z8669 Personal history of other diseases of the nervous system and sense organs: Secondary | ICD-10-CM | POA: Insufficient documentation

## 2013-01-21 DIAGNOSIS — Z880 Allergy status to penicillin: Secondary | ICD-10-CM | POA: Insufficient documentation

## 2013-01-21 NOTE — ED Provider Notes (Signed)
CSN: [id number]     Arrival date & time 01/21/13  1737 History     First MD Initiated Contact with Patient 01/21/13 1800     Chief Complaint  Patient presents with  . Back Pain   (Consider location/radiation/quality/duration/timing/severity/associated sxs/prior Treatment) Patient is a 79 y.o. female presenting with back pain. The history is provided by the patient and a relative. The history is limited by the condition of the patient.  Back Pain Associated symptoms: no fever, no numbness and no weakness   pt difficult/limited historian, hx dementia - level 5 caveat. Per family/caregiver, pt had initially c/o right mid to upper flank pain onset this morning. On arrival to ed, denies any right flank pain, and c/o left mid to lower flank pain. Constant. Moderate. Non radiating. No specific exacerbating or alleviating factors. No dysuria or hematuria. Denies fall/trauma, bending or strain. At baseline walks minimally w walker. No hx chronic back pain or compression fxs. No anterior/abd/pelvic pain. No fever or chills. Normal appetite. No nv. Had normal bm today. No radicular pain or leg pain. No numbness/weankess. No skin changes, rash or lesions in area of pain.     Past Medical History  Diagnosis Date  . OP (osteoporosis)   . Adenomatous polyps   . Hearing loss   . Hyperlipidemia   . Urge urinary incontinence   . Hypothyroidism   . Cervical prolapse   . Renal insufficiency   . PONV (postoperative nausea and vomiting)   . Hypertension   . GERD (gastroesophageal reflux disease)   . TIA (transient ischemic attack)     "multiple"  . Dementia     "really can't remember anything"  . Complete uterine prolapse   . Dementia    Past Surgical History  Procedure Laterality Date  . Wrist fracture surgery  2008    S/P fall; ?right  . Cataract extraction w/ intraocular lens  implant, bilateral  1990's   Family History  Problem Relation Age of Onset  . Osteoporosis Mother   . Dementia  Father    History  Substance Use Topics  . Smoking status: Former Smoker -- 0.50 packs/day for 1 years    Types: Cigarettes    Quit date: 06/08/1942  . Smokeless tobacco: Never Used  . Alcohol Use: Yes     Comment: "last alcohol was before 1994"   OB History   Grav Para Term Preterm Abortions TAB SAB Ect Mult Living   5 4 4  1     4      Review of Systems  Unable to perform ROS: Dementia  Constitutional: Negative for fever.  Genitourinary: Positive for flank pain.  Musculoskeletal: Positive for back pain.  Neurological: Negative for weakness and numbness.  level 5 caveat.     Allergies  Ciprocin-fluocin-procin; Penicillins; Tramadol hcl; and Dilaudid  Home Medications   Current Outpatient Rx  Name  Route  Sig  Dispense  Refill  . acetaminophen (TYLENOL) 325 MG tablet   Oral   Take 650 mg by mouth every 6 (six) hours as needed for pain.         Marland Kitchen aspirin EC 81 MG tablet   Oral   Take 81 mg by mouth every morning.         Marland Kitchen atorvastatin (LIPITOR) 20 MG tablet   Oral   Take 20 mg by mouth every evening.         . calcium-vitamin D (OSCAL WITH D) 500-200 MG-UNIT per tablet   Oral  Take 1 tablet by mouth 2 (two) times daily.           . chlorthalidone (HYGROTON) 25 MG tablet   Oral   Take 25 mg by mouth every morning.         . donepezil (ARICEPT) 10 MG tablet   Oral   Take 10 mg by mouth every evening.         Marland Kitchen estradiol (ESTRING) 2 MG vaginal ring   Vaginal   Place 2 mg vaginally every 3 (three) months. Insert a new ring into vagina every 3 months   1 each   4   . levothyroxine (SYNTHROID, LEVOTHROID) 100 MCG tablet   Oral   Take 100 mcg by mouth daily before breakfast.         . memantine (NAMENDA) 10 MG tablet   Oral   Take 10 mg by mouth 2 (two) times daily.         Marland Kitchen omeprazole (PRILOSEC) 20 MG capsule   Oral   Take 20 mg by mouth every evening.          BP 134/50  Pulse 65  Temp(Src) 97.7 F (36.5 C) (Oral)  Resp 18   SpO2 100% Physical Exam  Nursing note and vitals reviewed. Constitutional: She appears well-developed and well-nourished. No distress.  HENT:  Head: Atraumatic.  Eyes: Conjunctivae are normal. No scleral icterus.  Neck: Neck supple. No tracheal deviation present.  Cardiovascular: Normal rate.   Pulmonary/Chest: Effort normal and breath sounds normal. No respiratory distress.  Abdominal: Soft. Normal appearance and bowel sounds are normal. She exhibits no distension and no mass. There is no tenderness. There is no rebound and no guarding.  Genitourinary:  No cva tenderness  Musculoskeletal: She exhibits no edema and no tenderness.  CTLS spine, non tender, aligned, no step off. Good rom bil hips without pain.   Neurological: She is alert.  Motor intact bil.   Skin: Skin is warm and dry. No rash noted.  No skin lesions, rash, abscess, or sts in area of pain.   Psychiatric: She has a normal mood and affect.    ED Course   Procedures (including critical care time) Ct Abdomen Pelvis Wo Contrast  01/21/2013   *RADIOLOGY REPORT*  Clinical Data: 79 year old female with left flank pain.  Combative.  CT ABDOMEN AND PELVIS WITHOUT CONTRAST  Technique:  Multidetector CT imaging of the abdomen and pelvis was performed following the standard protocol without intravenous contrast.  Comparison: CT abdomen and pelvis 11/21/2012 and earlier.  Findings: Stable lung bases.  No pericardial or pleural effusion. Moderate gastric hiatal hernia appears stable.  Osteopenia. No acute osseous abnormality identified.  No pelvic free fluid.  Gas and stool distend the rectum.  Stable pessary type device.  Decompressed bladder.  Noncontrast uterus and adnexa within normal limits.  Severe diverticulosis of the sigmoid colon.  Continued sigmoid wall thickening, but improved from prior study.  No definite stranding in the sigmoid mesentery.  Mild to moderate diverticulosis throughout the left colon without definite  inflammation.  Mild motion artifact in the upper abdomen. Continued occasional diverticula in the colon.  Normal appendix. No dilated small bowel.  Negative intra-abdominal stomach. Duodenal diverticulum re-identified.  Stable small circumscribed low density areas in the liver compatible with benign etiology.  Negative gallbladder, affected by respiratory motion.  Negative noncontrast spleen, pancreas, and adrenal glands.  No abdominal free fluid identified.  No hydronephrosis, nephrolithiasis or perinephric stranding.  No left hydroureter.  Extensive calcified atherosclerosis of the aorta.  Mild infrarenal ectasia is stable.  Heavily calcified iliac arteries, the left common iliac artery could be chronically occluded.  IMPRESSION: 1.  No urologic calculus or obstructive uropathy. 2.  No definite acute inflammatory changes in the abdomen or pelvis. However, there is chronic severe sigmoid diverticulosis and it is difficult to exclude mild recurrent diverticulitis.   Original Report Authenticated By: Erskine Speed, M.D.      MDM  Labs. Ct.  Reviewed nursing notes and prior charts for additional history.   Ct neg acute. Etiology pain c/o earlier unclear.  Son notes at home and on way to ED was right flank pain, by time of my eval that had completely resolved and pt c/o left flank pain.  Currently pt not having any pain.  Recheck pt. Pt denies pain, or wanting anything for pain.    Pt denies dysuria, no fever.  Pt refuses ua/cath.   abd soft nt. Spine nt. No pain. Pt continues to refuse cath/ua.  Pt stable for discharge.   Suzi Roots, MD 01/21/13 2046

## 2013-01-21 NOTE — ED Notes (Signed)
Pt states unable to urinate at this time. 

## 2013-01-21 NOTE — ED Notes (Signed)
Pt refused in and out cath. Pt unable to urinate in bedpan. Dr Denton Lank aware.

## 2013-01-21 NOTE — ED Notes (Signed)
Bed: BM84 Expected date: 01/21/13 Expected time: 5:51 PM Means of arrival: Ambulance Comments: CA Pt, tachy, shob

## 2013-01-21 NOTE — ED Notes (Signed)
Onset right lower back pain this morning. Pt has hx of kidney stones, states this pain feels like that.

## 2013-03-13 ENCOUNTER — Other Ambulatory Visit: Payer: Self-pay | Admitting: Neurology

## 2013-03-13 DIAGNOSIS — Z0289 Encounter for other administrative examinations: Secondary | ICD-10-CM

## 2013-03-15 ENCOUNTER — Encounter: Payer: Self-pay | Admitting: Gynecology

## 2013-03-20 ENCOUNTER — Telehealth: Payer: Self-pay | Admitting: Gynecology

## 2013-03-20 NOTE — Telephone Encounter (Signed)
Patient's son "Cletis Athens" is calling concerning an appointment for his Mother. Cletis Athens says a home health aid was giving his mother a bath and noticed her uterus appeared to be coming out. Please call.

## 2013-03-20 NOTE — Telephone Encounter (Signed)
Spoke with son, Jonny Ruiz again. He states that Aides completed incontinence care and did not see any prolapse. Patient is urinating well and having BM's. OV scheduled for tomorrow cancelled and reminded of follow up appointment with Dr. Farrel Gobble for 11/19, son is agreeable.

## 2013-03-20 NOTE — Telephone Encounter (Addendum)
Spoke with [name]. He is unsure if any new prolapse has occurred or Pessary has fallen out. He states that home health aides are going to check today again while toileting patient. He would like to have patient checked.  Scheduled with Dr. Hyacinth Meeker for tomorrow at 10 am (Dr. Farrel Gobble not in office) for pessary check.    Patient has h/o dementia.

## 2013-03-20 NOTE — Telephone Encounter (Signed)
Chief Complaint  Patient presents with  . Appointment    pt's son called back to cancel appt states it is not needed

## 2013-03-21 ENCOUNTER — Ambulatory Visit: Payer: Self-pay | Admitting: Obstetrics & Gynecology

## 2013-03-31 ENCOUNTER — Ambulatory Visit: Payer: Medicare Other | Admitting: Gynecology

## 2013-04-18 ENCOUNTER — Ambulatory Visit: Payer: Medicare Other | Admitting: Gynecology

## 2013-04-24 ENCOUNTER — Ambulatory Visit: Payer: Medicare Other | Admitting: Gynecology

## 2013-04-26 ENCOUNTER — Ambulatory Visit (INDEPENDENT_AMBULATORY_CARE_PROVIDER_SITE_OTHER): Payer: Medicare Other | Admitting: Gynecology

## 2013-04-26 VITALS — BP 134/80 | HR 76 | Resp 14 | Ht 63.5 in

## 2013-04-26 DIAGNOSIS — F0280 Dementia in other diseases classified elsewhere without behavioral disturbance: Secondary | ICD-10-CM

## 2013-04-26 DIAGNOSIS — N813 Complete uterovaginal prolapse: Secondary | ICD-10-CM

## 2013-04-26 NOTE — Progress Notes (Signed)
79 y.o. widowed White female [phone] here for pessary check.  Patient has been using following pessary style and size:  gelhorn 2.5cm.  She is not sexually active.  She describes the following issues with the pessary: none,pt is doing well with estring behind pessary.  Pt has urinary and fecal incontinence.  Here with daughter, has aides 24h/d.  ROS: pt has dementia, daughter unaware of issues with vaginal bleeding  Exam:   BP 134/80  Pulse 76  Resp 14  Ht 5' 3.5" (1.613 m) General appearance: combative and and pleasant Inguinal adenopathy: negative   Pelvic: External genitalia:  atrophic appearance              Urethra: normal appearing urethra with no masses, tenderness or lesions              Bartholins and Skenes: normal                 Vagina: atrophic posterior wall ecchymosis with skin intact              Cervix: limited visualization due to discomfort Bimanual Exam:  Uterus:  atrophic                               Adnexa:    not indicated                               Anus:  defer exam  Pt placed in dorsal lithotomy position, lidocaine jelly placed in the vagina.  Granular feel to pessary noted.  Pt adherent to vaginal wall, unable to break suction with multiple attempts. Re attempt after rest and gentle rotation eventually allowed the suction to break and pessary ultimately removed.  Speculum placed and ecchymosis as noted. Skin intact. Pessary was removed with difficulty.   Pessary was not replaced. Estring placed and small amount of estrace cream.  Patient tolerated procedure well.   Suggest changing pessary to either ring with support or incontinence ring.  Daughter able to feel difference in both and agrees to change, will call when ring available  A: procidentia   dementia  P:  Will have pt return when different pessary available, will call 27m spent trying to unlodge pessary for removal, extra time needed due to pt's combative nature  An After Visit Summary was printed  and given to the patient.

## 2013-04-27 ENCOUNTER — Telehealth: Payer: Self-pay | Admitting: Gynecology

## 2013-04-27 NOTE — Telephone Encounter (Signed)
Will route to Dr. Farrel Gobble for request.

## 2013-04-27 NOTE — Telephone Encounter (Signed)
Patient's son calling re: concerns about patient's "tramatic visit yesterday." The patient's son was not here for the visit, his sister was, but he is inquiring about finding another way to treat his mom instead of using a pessary. He and his sister "do not want her to go though that every 90 days any longer." He wants to know what the other alternatives might be? Please advise?

## 2013-04-27 NOTE — Telephone Encounter (Signed)
Do you still want to order the pessary?

## 2013-04-29 ENCOUNTER — Other Ambulatory Visit: Payer: Self-pay | Admitting: Neurology

## 2013-05-01 NOTE — Telephone Encounter (Signed)
Spoke with pt's son regarding his mother's pessary and complete procidentia.  Pt has dementia and placing and removing of the pessary has been an issue.  We had suggested placing an estring behind the pessary at last pessary check but when pt was brought in by daughter, the pessary was wedged and very difficult to remove.  It was traumatic emotionally to the pt and daughter.  There were no vaginal ulcerations but only the new estring was placed.  We had suggested a change in pessary to a softer ring with support in lieu of her gelhorn, the pessary was shown to the daughter, who agreed. I discussed alternatives to the son.  His mother lives alone but has care givers with her.   She wears a diaper and has both fecal and urinary incontinence.  After our last visit, the uterus did not immediately prolapse, but we discussed risks of kinking urteters with complete prolapse.  I suggested we order the ring with support and consider either placing ring if prolapse recurs or consider a LeFort's.  I briefly discussed the procedure but also discussed the impact of anesthesia on the elderly. I will send him some info and they will discuss as a family and decide what is best for her.

## 2013-05-08 ENCOUNTER — Telehealth: Payer: Self-pay | Admitting: *Deleted

## 2013-05-08 NOTE — Telephone Encounter (Signed)
Call to patient home, spoke to son Jonny Ruiz, notified that pessary has arrived if they desire to use it.  He does not want his mom to have pessary inserted. Very interested in LeFort procedure and requests OV/consult to discuss this with DrLathrop and family members. Appt scheduled for 05-23-13 at 430.  Due to several family members schedule, declined earlier appointment.  Routing to provider for final review. Patient agreeable to disposition. Will close encounter

## 2013-05-23 ENCOUNTER — Ambulatory Visit (INDEPENDENT_AMBULATORY_CARE_PROVIDER_SITE_OTHER): Payer: PRIVATE HEALTH INSURANCE | Admitting: Gynecology

## 2013-05-23 DIAGNOSIS — F0280 Dementia in other diseases classified elsewhere without behavioral disturbance: Secondary | ICD-10-CM

## 2013-05-23 DIAGNOSIS — N813 Complete uterovaginal prolapse: Secondary | ICD-10-CM

## 2013-05-23 NOTE — Progress Notes (Signed)
Pt's family here for consultation of management of care for mother.  Pt with dementia and is  Traumatized by both exchanging pessary and vaginal estrogen.  We elected to not replace pessary after last visit, and had suggested either a change in pessary or surgical repair. They acknowledge that she is a poor surgical candidate based on age alone. We had offered them a LeFort's procedure, and information on it was provided before today's appt.  We discussed the benefits of it being quick allowing for minimal anesthesia and no maintenance after post-op reapir.    Her PCP does not have privideges at [facility]. We offered a referral to university setting and if unable we will try to coordinate care here.  We'd prefer for her to be at [facility] for medical management and they are agreeable. Risks of bleeding, infection, damage to bowel or bladder were reviewed, risks of dvt and pe also discussed.  They are aware based on age that she is at risk for worsening mental status, CHF. They feel that something must be done for her overall well being and want to proceed. We will arrange for coordinated care and contact person will be sonJaana Brodt [phone]  21m spent discussing treatment options for complete procidentia, >50% face to face

## 2013-05-26 ENCOUNTER — Telehealth: Payer: Self-pay | Admitting: Emergency Medicine

## 2013-05-26 NOTE — Telephone Encounter (Signed)
Dr. Farrel Gobble spoke with Dr. Haskell Riling office and advised patient could be seen there.  I called John, patient's son, so wants to know what will happen at this appointment and why they should go to this appointment. He states he is on his way to a funeral and cannot talk about this now. I advised I could call him back on Monday.   [facility] Urogynecology with Dr. Dorna Bloom: Contact number [phone]  [place] Location: Alli is contact for scheduling. Fax is [phone] to fax records.

## 2013-05-26 NOTE — Telephone Encounter (Signed)
I had discussed with the family that since she is 77 and had other co-morbidities that she might do better in a place with residents and fellows and they were agreeable.  This is the outpt clinic for [facility], they have 2 outpt clinics one in [employer] and one in [place].  They will do the initial eval but agree that the [facility] is the best for her

## 2013-05-26 NOTE — Telephone Encounter (Signed)
Dr. Farrel Gobble, can you give me some more information to tell this family member. He wanted to know why she should go to this office and what they will do at appointment.

## 2013-06-15 NOTE — Telephone Encounter (Signed)
Pt's son Jenny Reichmann calling to speak with nurse or Dr Charlies Constable regarding pt.

## 2013-06-16 NOTE — Telephone Encounter (Signed)
Spoke with Jenny Reichmann patients son he is confused about the referral to Dr. Elba Barman @ [facility] Urogynecology and What part Dr Elba Barman will play in this. He said he was under the impression that Dr. Charlies Constable would be doing The surgery and now he's totally confused and needs some clarification. Please advise

## 2013-06-18 NOTE — Telephone Encounter (Signed)
We had discussed the additional medical issues that could arise due to his mother's age and had suggested if we could find a place that could coordinate the care better he was agreeable to having it done there.  i had spoken to Dr Elba Barman, and since she has both residents and fellows, his mother would have more hands on and staff available 24/7 since it will be done at a teaching institution.  Based on my understanding of our meeting, i tried to make appt for them to have her assessed there-[place] office

## 2013-06-19 NOTE — Telephone Encounter (Signed)
Sorry for not being clear, can you call and remind them of our prior conversation

## 2013-06-19 NOTE — Telephone Encounter (Signed)
Any further follow up needed or can we close this encounter?

## 2013-06-20 NOTE — Telephone Encounter (Signed)
Call to [name] and reviewed info from Dr Charlies Constable.  [name] states he was just confused thinking that Dr Charlies Constable would still be able to assist with the surgery. Advised that Dr Charlies Constable  Does not have privileges at [facility] so we would have Dr Elba Barman manage all of the surgical care arrangements. He no longer has phone number to contact dr Meredith Staggers office. Given phone number of [phone] for Dr Blinda Leatherwood office and if needs assistance with info on referral/Ally/[phone].  Routing to provider for final review. Patient agreeable to disposition. Will close encounter

## 2013-06-23 ENCOUNTER — Telehealth: Payer: Self-pay | Admitting: Gynecology

## 2013-06-23 NOTE — Telephone Encounter (Signed)
Dr. Charlies Constable, do you have any suggestions? [facility] has only female MD providers. I was advised that patient always had to see MD for first visit then can see a female NP if needed.

## 2013-06-23 NOTE — Telephone Encounter (Signed)
Patient's son "[name]" called with appointment info, Dr. Elba Barman 08/21/2013. [name] is asking if Dr.Lathrop could recommend a female Dealer in [place].

## 2013-06-26 NOTE — Telephone Encounter (Signed)
No female urologists, same issues regarding post-op care if done at  [facility]

## 2013-06-26 NOTE — Telephone Encounter (Signed)
Dr. Charlies Constable, the patient is not having those issues at this time, but the concern from family family, son Wille Glaser, is how long is too long to wait for initial consult appointment with Dr. Elba Barman as he says they have trouble assessing if patient is voiding well or not.

## 2013-06-26 NOTE — Telephone Encounter (Signed)
If she is not urinating, she should be seen, can they get her here today or tomorrow?

## 2013-06-26 NOTE — Telephone Encounter (Signed)
Spoke with Bobette Mo. He is concerned that first available appointment with Dr. Elba Barman is not until 08/11/13. Wille Glaser is wondering if this will be early enough as this is initial consult appointment, so surgery wouldn't be scheduled for awhile. Advised again that [facility] would be most appropriate place for care as per Dr. Brion Aliment message prior that they have residents/on call doctors. He is wondering if he should try to find a urogynecologist in [place] area as well. He has questions as well, as he states he does not know if his mother is having issues with not urinating. He wants to know, "what level of risk are we dealing with?" by waiting for an appointment. He is concerned about not being able to tell if she is sick. We discussed the need for MD evaluation if patient acts out of her usual, which son states is difficult to know because of hx of dementia, fevers, difficulty urinating, increased fatigue, nausea, vomting, refusal to eat or drink. He is concerned that that he will miss a sign of her being sick. Advised I would attempt to call Dr. Meredith Staggers office and send message to Dr. Charlies Constable for further advice.    Called Dr. Meredith Staggers office, office is closed today. Left message to call back. Will attempt to see if earlier appointment can be made.  [facility] Urogynecology with Dr. Elba Barman: Contact number [phone]

## 2013-06-30 NOTE — Telephone Encounter (Signed)
Called pt's son and reviewed symptoms to be aware of regarding urosepsis.  Pt is incontinent and has around the clock aids, to instruct them to reports procidentia if occurs, to his knowledge, it has not recurred since pessary removed but he will review with aids.  Otherwise pt seems to be status quo.  Assured him he can contact us if he has concerns prior to being seen at [facility] 3/6.  We will try to contact them regarding getting pt on cancellation list.  agreeable

## 2013-07-21 ENCOUNTER — Telehealth: Payer: Self-pay | Admitting: Emergency Medicine

## 2013-07-21 NOTE — Telephone Encounter (Signed)
Dr Charlies Constable, just [facility].  If no further follow-up needed, ok to close encounter?

## 2013-07-21 NOTE — Telephone Encounter (Signed)
Spoke with Radonna Ricker at the [place] office at [phone]. She states that there are no earlier appointments than what the patient already has scheduled for the [facility] office with Dr. Elba Barman for 3/6 at 1:00. Spoke with [place] at [facility] office and patient has earliest available appointment, only other option is to see a Fellow on 3/5. She advised patient to keep current appointment.

## 2013-07-21 NOTE — Telephone Encounter (Signed)
Message copied by Michele Mcalpine on Fri Jul 21, 2013 10:13 AM ------      Message from: Jaymes Graff      Created: Mon Jul 17, 2013  7:01 PM      Regarding: waiting loist       Can you call and ask these people to put her on a cancel list.  Her appt is like 3 months out and Dr lathrop told them she would see if there was anything we could do.  Try [phone] or Radonna Ricker at [phone].  Call me if questions.   ------

## 2013-08-02 ENCOUNTER — Emergency Department (HOSPITAL_COMMUNITY): Payer: Medicare Other

## 2013-08-02 ENCOUNTER — Encounter (HOSPITAL_COMMUNITY): Payer: Self-pay | Admitting: Emergency Medicine

## 2013-08-02 ENCOUNTER — Emergency Department (HOSPITAL_COMMUNITY)
Admission: EM | Admit: 2013-08-02 | Discharge: 2013-08-02 | Disposition: A | Discharge status: Home / Self Care | Payer: Medicare Other | Attending: Emergency Medicine | Admitting: Emergency Medicine

## 2013-08-02 ENCOUNTER — Telehealth: Payer: Self-pay | Admitting: Gynecology

## 2013-08-02 DIAGNOSIS — I10 Essential (primary) hypertension: Secondary | ICD-10-CM | POA: Insufficient documentation

## 2013-08-02 DIAGNOSIS — Z880 Allergy status to penicillin: Secondary | ICD-10-CM | POA: Insufficient documentation

## 2013-08-02 DIAGNOSIS — R059 Cough, unspecified: Secondary | ICD-10-CM

## 2013-08-02 DIAGNOSIS — Z8739 Personal history of other diseases of the musculoskeletal system and connective tissue: Secondary | ICD-10-CM | POA: Insufficient documentation

## 2013-08-02 DIAGNOSIS — Z860100 Personal history of colon polyps, unspecified: Secondary | ICD-10-CM | POA: Insufficient documentation

## 2013-08-02 DIAGNOSIS — E785 Hyperlipidemia, unspecified: Secondary | ICD-10-CM | POA: Insufficient documentation

## 2013-08-02 DIAGNOSIS — Z8742 Personal history of other diseases of the female genital tract: Secondary | ICD-10-CM | POA: Insufficient documentation

## 2013-08-02 DIAGNOSIS — Z79899 Other long term (current) drug therapy: Secondary | ICD-10-CM | POA: Insufficient documentation

## 2013-08-02 DIAGNOSIS — Z7982 Long term (current) use of aspirin: Secondary | ICD-10-CM | POA: Insufficient documentation

## 2013-08-02 DIAGNOSIS — Z87891 Personal history of nicotine dependence: Secondary | ICD-10-CM | POA: Insufficient documentation

## 2013-08-02 DIAGNOSIS — F0390 Unspecified dementia without behavioral disturbance: Secondary | ICD-10-CM | POA: Insufficient documentation

## 2013-08-02 DIAGNOSIS — K219 Gastro-esophageal reflux disease without esophagitis: Secondary | ICD-10-CM | POA: Insufficient documentation

## 2013-08-02 DIAGNOSIS — H9190 Unspecified hearing loss, unspecified ear: Secondary | ICD-10-CM | POA: Insufficient documentation

## 2013-08-02 DIAGNOSIS — R05 Cough: Secondary | ICD-10-CM

## 2013-08-02 DIAGNOSIS — Z8673 Personal history of transient ischemic attack (TIA), and cerebral infarction without residual deficits: Secondary | ICD-10-CM | POA: Insufficient documentation

## 2013-08-02 DIAGNOSIS — E039 Hypothyroidism, unspecified: Secondary | ICD-10-CM | POA: Insufficient documentation

## 2013-08-02 NOTE — Discharge Instructions (Signed)

## 2013-08-02 NOTE — Telephone Encounter (Signed)
Pt's son is calling to get information for the dr she was referred to in [place]. Needing the phone number and address.

## 2013-08-02 NOTE — ED Provider Notes (Signed)
CSN: [id number]     Arrival date & time 08/02/13  1033 History   First MD Initiated Contact with Patient 08/02/13 1231     Chief Complaint  Patient presents with  . Cough     (Consider location/radiation/quality/duration/timing/severity/associated sxs/prior Treatment) Patient is a 80 y.o. female presenting with cough.  Cough Cough characteristics:  Non-productive Severity:  Moderate Onset quality:  Gradual Duration:  4 days Timing:  Constant Progression:  Worsening Chronicity:  New Context: not sick contacts and not upper respiratory infection   Relieved by: robitussin. Worsened by:  Nothing tried Associated symptoms: no chest pain, no chills, no diaphoresis, no fever, no shortness of breath and no sinus congestion     Past Medical History  Diagnosis Date  . OP (osteoporosis)   . Adenomatous polyps   . Hearing loss   . Hyperlipidemia   . Urge urinary incontinence   . Hypothyroidism   . Cervical prolapse   . Renal insufficiency   . PONV (postoperative nausea and vomiting)   . Hypertension   . GERD (gastroesophageal reflux disease)   . TIA (transient ischemic attack)     "multiple"  . Dementia     "really can't remember anything"  . Complete uterine prolapse   . Dementia    Past Surgical History  Procedure Laterality Date  . Wrist fracture surgery  2008    S/P fall; ?right  . Cataract extraction w/ intraocular lens  implant, bilateral  1990's   Family History  Problem Relation Age of Onset  . Osteoporosis Mother   . Dementia Father    History  Substance Use Topics  . Smoking status: Former Smoker -- 0.50 packs/day for 1 years    Types: Cigarettes    Quit date: 06/08/1942  . Smokeless tobacco: Never Used  . Alcohol Use: Yes     Comment: "last alcohol was before 1994"   OB History   Grav Para Term Preterm Abortions TAB SAB Ect Mult Living   5 4 4  1     4      Review of Systems  Constitutional: Negative for fever, chills and diaphoresis.  HENT:  Negative for congestion.   Respiratory: Positive for cough. Negative for shortness of breath.   Cardiovascular: Negative for chest pain.  Gastrointestinal: Negative for nausea, vomiting, abdominal pain and diarrhea.  All other systems reviewed and are negative.      Allergies  Ciprocin-fluocin-procin; Penicillins; Tramadol hcl; and Dilaudid  Home Medications   Current Outpatient Rx  Name  Route  Sig  Dispense  Refill  . acetaminophen (TYLENOL) 325 MG tablet   Oral   Take 650 mg by mouth every 6 (six) hours as needed for pain.         Marland Kitchen aspirin EC 81 MG tablet   Oral   Take 81 mg by mouth every morning.         Marland Kitchen atorvastatin (LIPITOR) 20 MG tablet   Oral   Take 20 mg by mouth every evening.         . Calcium Carbonate (CALCI-CHEW PO)   Oral   Take 1 tablet by mouth daily after breakfast.         . cetirizine (ZYRTEC) 10 MG tablet   Oral   Take 5-10 mg by mouth See admin instructions. 5mg  every morning and 10mg  every evening         . chlorthalidone (HYGROTON) 25 MG tablet   Oral   Take 25 mg by mouth  every morning.         . docusate sodium (COLACE) 100 MG capsule   Oral   Take 100 mg by mouth at bedtime as needed for mild constipation.         Marland Kitchen donepezil (ARICEPT) 10 MG tablet   Oral   Take 10 mg by mouth at bedtime.         Marland Kitchen estradiol (ESTRING) 2 MG vaginal ring   Vaginal   Place 2 mg vaginally every 3 (three) months. Insert a new ring into vagina every 3 months   1 each   4   . guaiFENesin-dextromethorphan (ROBITUSSIN DM) 100-10 MG/5ML syrup   Oral   Take 10 mLs by mouth every 4 (four) hours as needed for cough.         . levothyroxine (SYNTHROID, LEVOTHROID) 100 MCG tablet   Oral   Take 100 mcg by mouth daily before breakfast.         . memantine (NAMENDA) 10 MG tablet   Oral   Take 10 mg by mouth 2 (two) times daily.         Marland Kitchen omeprazole (PRILOSEC) 20 MG capsule   Oral   Take 20 mg by mouth every morning.          .  psyllium (REGULOID) 0.52 G capsule   Oral   Take 1.04 g by mouth 2 (two) times daily.           BP 148/113  Pulse 62  Temp(Src) 98.4 F (36.9 C) (Oral)  Resp 20  SpO2 98% Physical Exam  Nursing note and vitals reviewed. Constitutional: She is oriented to person, place, and time. She appears well-developed and well-nourished. No distress.  HENT:  Head: Normocephalic and atraumatic.  Mouth/Throat: Oropharynx is clear and moist.  Eyes: Conjunctivae are normal. Pupils are equal, round, and reactive to light. No scleral icterus.  Neck: Neck supple.  Cardiovascular: Normal rate, regular rhythm, normal heart sounds and intact distal pulses.   No murmur heard. Pulmonary/Chest: Effort normal and breath sounds normal. No stridor. No respiratory distress. She has no wheezes. She has no rales.  Abdominal: Soft. Bowel sounds are normal. She exhibits no distension. There is no tenderness.  Musculoskeletal: Normal range of motion.  Neurological: She is alert and oriented to person, place, and time.  Skin: Skin is warm and dry. No rash noted.  Psychiatric: She has a normal mood and affect. Her behavior is normal.    ED Course  Procedures (including critical care time) Labs Review Labs Reviewed - No data to display Imaging Review Dg Chest 2 View  08/02/2013   CLINICAL DATA:  Cough  EXAM: CHEST  2 VIEW  COMPARISON:  DG CHEST 2V dated 03/23/2013  FINDINGS: Costochondral calcifications project over the thorax limiting the exam. Lungs are grossly clear. Normal heart size. No pneumothorax.  IMPRESSION: No active cardiopulmonary disease.   Electronically Signed   By: Maryclare Bean M.D.   On: 08/02/2013 12:05  All radiology studies independently viewed by me.     EKG Interpretation   None       MDM   Final diagnoses:  Cough    80 yo female with cough for several days.  No other infectious symptoms.  Well appearing.  Lungs CTAB.  No complaint of SOB.  Given her well appearance, normal exam,  negative CXR, normal vitals, good O2 sats, I think she is stable for outpatient management.  I do not see an indication  for empiric antibiotics.  I did advised follow up for recheck within 2 days.      Houston Siren III, MD 08/02/13 [phone]

## 2013-08-02 NOTE — ED Notes (Signed)
Cough since Sunday; unable to sleep; nonproductive;

## 2013-08-02 NOTE — ED Notes (Signed)
Initial Contact - pt to room from CXR with family at bs.  Reports non-prod cough x4 days, denies CP, fevers/chills or other complaints.  Skin PWD.  MAEI.  Speaking full/clear sentences, ls congested, rr even/un-lab.  NAD.  Awaiting EDP eval.

## 2013-08-04 NOTE — Telephone Encounter (Signed)
Called son, Wille Glaser.   Advised Appointment is with Dr. Elba Barman at [place] on 3/6 at 1:00 advised to arrive early.   [place], [place] [address]  Phone is [phone].   Verbalized understanding.  Routing to provider for final review. Patient agreeable to disposition. Will close encounter

## 2013-08-28 ENCOUNTER — Ambulatory Visit (INDEPENDENT_AMBULATORY_CARE_PROVIDER_SITE_OTHER): Payer: Medicare Other | Admitting: Neurology

## 2013-08-28 ENCOUNTER — Encounter: Payer: Self-pay | Admitting: Neurology

## 2013-08-28 VITALS — BP 124/61 | HR 75 | Wt 142.0 lb

## 2013-08-28 DIAGNOSIS — F0390 Unspecified dementia without behavioral disturbance: Secondary | ICD-10-CM

## 2013-08-28 DIAGNOSIS — R269 Unspecified abnormalities of gait and mobility: Secondary | ICD-10-CM

## 2013-08-28 NOTE — Patient Instructions (Signed)
Alzheimer Disease Alzheimer Disease (AD) is a mental disorder. It causes memory loss and loss of other mental functions, such as learning, thinking, solving problems, communicating, and completing tasks. The mental losses interfere with the ability to perform daily activities at work, at home, or in social situations. AD usually starts in the late 60s or early 70s but can start earlier in life (familial form). The mental changes caused by AD are permanent and worsen over time. As the illness progresses, the ability to do even the simplest things is lost. Survival with AD ranges from several years to as long as 20 years. CAUSES AD is caused by abnormally high levels of a protein (beta-amyloid) in the brain. This protein forms very small deposits within and around the brain's nerve cells. These deposits prevent the nerve cells from working properly. Experts are not certain what causes the beta-amyloid deposits in AD. RISK FACTORS The following major risk factors have been identified:  Increasing age.  Certain genetic variations, such as Down syndrome (trisomy 21). SYMPTOMS The earliest mental change in AD is mild memory loss of recent events, names, or phone numbers. Other symptoms at the beginning of AD include loss of objects, minor loss of vocabulary, and difficulty with complex tasks, such as paying bills or driving in unfamiliar locations. At this stage, you are still able to perform daily activities but need greater effort, more time, or memory aids. Other mental functions deteriorate as AD worsens. These changes slowly go from mild to severe. Symptoms at this stage include:  Difficulty remembering You may not be able to recall personal information such as your address and telephone number. You may become confused about the date, the season of the year, or your location.  Difficulty maintaining attention You may forget what you wanted to say during conversations and repeat what you have already  said.  Difficulty learning new information or tasks You may not remember what you read or the name of a new friend you met.  Difficulty counting or doing math You may have difficulty with complex math problems. You may make mistakes in paying bills or managing your checkbook.  Poor reasoning and judgment You may make poor decisions or not dress right for the weather.  Difficulty communicating You may have regular difficulty remembering words, naming objects, expressing yourself clearly, or writing sentences that make sense.  Difficulty performing familiar daily activities You may get lost driving in familiar locations or need help eating, bathing, dressing, grooming, or using the toilet. You may have difficulty maintaining bladder or bowel control.  Difficulty recognizing familiar faces You may confuse family members or close friends with one another. You may not recognize a close relative or may mistake strangers for family. AD also may cause changes in personality and behavior. These changes include loss of interest or motivation, social withdrawal, anxiety, difficulty sleeping, uncharacteristic anger or combativeness, a false belief that someone is trying to harm you (paranoia), seeing things that are not real (hallucinations), or agitation. Confusion and disruptive behavior are often worse at night and may be triggered by changes in the environment or acute medical issues. DIAGNOSIS  AD is diagnosed through an assessment by your health care provider. During this assessment, your health care provider will do the following:  Ask you and your family, friends, or caregiver questions about your symptoms, their frequency, their duration and progression, and the effect they are having on your life.  Ask questions about your personal and family medical history and use   of alcohol or drugs, including prescription medicine.  Perform a physical exam and order blood tests and brain imaging exams. Your  health care provider may refer you to a specialist for detailed evaluation of your mental functions (neuropsychological testing).  Many different brain disorders, medical conditions, and certain substances can cause symptoms that resemble AD symptoms. These must be ruled out before AD can be diagnosed. If AD is diagnosed, it will be considered either "possible" or "probable" AD. "Possible" AD means that your symptoms are typical of AD and no other disorder is causing them. "Probable" AD means that you also have a family history of AD or genetic test results that support the diagnosis. Certain tests, mostly used in research studies, are highly specific for AD.  TREATMENT  There is currently no cure for AD. The goals of treatment are to:  Slow down the progression of the disease.  Preserve mental function as long as possible.  Manage behavioral symptoms.  Make life easier for the person with AD and their caregivers. The following treatment options are available:  Medicine Certain medicines may help slow memory loss by changing the level of certain chemicals in the brain. Medicine may also help with behavioral symptoms.  Talk therapy Talk therapy provides education, support, and memory aids for people with AD. It is most effective in the early stages of the illness.  Caregiving Caregivers may be family members, friends, or trained medical professionals. They help the person with AD with daily life activities. Caregiving may take place at home or at a nursing facility.  Family support groups These provide education, emotional support, and information about community resources to family members who are taking care of the person with AD. Document Released: 02/04/2004 Document Revised: 01/25/2013 Document Reviewed: 09/30/2012 [facility] Patient Information 2014 [place], [place].

## 2013-08-28 NOTE — Progress Notes (Signed)
Reason for visit: Memory disturbance  [name] is an 80 y.o. female  History of present illness:  [name] is a 80 year old right-handed white female with a history of a progressive dementia. The patient was last seen through this office in October of 2013. The patient has been on Aricept and Namenda, and she currently is living in her home with 24-hour care. The patient is having some agitation when she is to take a bath or shower. The patient otherwise is well natured. The patient denies any pain in the extremities. The patient has had some problems with constipation, but no real issues with controlling the bladder. The patient is on a bowel regimen that includes milk of magnesia, a stool softener, and a fiber product. The patient has had a progressive worsening of her ability to ambulate, particularly over the last 3 months. The patient no longer is able to functionally use a walker due to a tendency to lean backwards. The patient will stand for transfers. The patient is sleeping well at night, no hallucinations are reported. A CT scan of the brain was done one year ago showing fairly extensive small vessel ischemic changes.  Past Medical History  Diagnosis Date  . OP (osteoporosis)   . Adenomatous polyps   . Hearing loss   . Hyperlipidemia   . Urge urinary incontinence   . Hypothyroidism   . Cervical prolapse   . Renal insufficiency   . PONV (postoperative nausea and vomiting)   . Hypertension   . GERD (gastroesophageal reflux disease)   . TIA (transient ischemic attack)     "multiple"  . Dementia     "really can't remember anything"  . Complete uterine prolapse   . Dementia   . Dementia   . History of cerebrovascular disease     Past Surgical History  Procedure Laterality Date  . Wrist fracture surgery  2008    S/P fall; ?right  . Cataract extraction w/ intraocular lens  implant, bilateral  1990's    Family History  Problem Relation Age of Onset  . Osteoporosis  Mother   . Dementia Father     Social history:  reports that she quit smoking about 71 years ago. Her smoking use included Cigarettes. She has a .5 pack-year smoking history. She has never used smokeless tobacco. She reports that she drinks alcohol. She reports that she does not use illicit drugs.    Allergies  Allergen Reactions  . Ciprocin-Fluocin-Procin [Fluocinolone] Rash    Reaction on arm IV, Red lines go up arm   . Penicillins Other (See Comments)    Reaction unknown   . Tramadol Hcl Nausea And Vomiting and Other (See Comments)    Hallucinations   . Dilaudid [Hydromorphone Hcl] Nausea And Vomiting and Other (See Comments)    Hallucinations     Medications:  Current Outpatient Prescriptions on File Prior to Visit  Medication Sig Dispense Refill  . acetaminophen (TYLENOL) 325 MG tablet Take 650 mg by mouth every 6 (six) hours as needed for pain.      Marland Kitchen aspirin EC 81 MG tablet Take 81 mg by mouth every morning.      Marland Kitchen atorvastatin (LIPITOR) 20 MG tablet Take 20 mg by mouth every evening.      . Calcium Carbonate (CALCI-CHEW PO) Take 1 tablet by mouth daily after breakfast.      . cetirizine (ZYRTEC) 10 MG tablet Take 5-10 mg by mouth See admin instructions. 5mg  every morning  and 10mg  every evening      . chlorthalidone (HYGROTON) 25 MG tablet Take 25 mg by mouth every morning.      . docusate sodium (COLACE) 100 MG capsule Take 100 mg by mouth at bedtime as needed for mild constipation.      Marland Kitchen donepezil (ARICEPT) 10 MG tablet Take 10 mg by mouth at bedtime.      Marland Kitchen guaiFENesin-dextromethorphan (ROBITUSSIN DM) 100-10 MG/5ML syrup Take 10 mLs by mouth every 4 (four) hours as needed for cough.      . levothyroxine (SYNTHROID, LEVOTHROID) 100 MCG tablet Take 100 mcg by mouth daily before breakfast.      . memantine (NAMENDA) 10 MG tablet Take 10 mg by mouth 2 (two) times daily.      Marland Kitchen omeprazole (PRILOSEC) 20 MG capsule Take 20 mg by mouth every morning.       . psyllium (REGULOID)  0.52 G capsule Take 1.04 g by mouth 2 (two) times daily.       Marland Kitchen estradiol (ESTRING) 2 MG vaginal ring Place 2 mg vaginally every 3 (three) months. Insert a new ring into vagina every 3 months  1 each  4   No current facility-administered medications on file prior to visit.    ROS:  Out of a complete 14 system review of symptoms, the patient complains only of the following symptoms, and all other reviewed systems are negative.  Memory disturbance Gait disturbance, confusion  Blood pressure 124/61, pulse 75, weight 142 lb (64.411 kg).  Physical Exam  General: The patient is alert and cooperative at the time of the examination.  Skin: No significant peripheral edema is noted.   Neurologic Exam  Mental status: The Mini-Mental status examination done today shows a total score of 11/30.  Cranial nerves: Facial symmetry is present. Speech is normal, no aphasia or dysarthria is noted. Extraocular movements are full. Visual fields are full.  Motor: The patient has good strength in all 4 extremities.  Sensory examination: Soft touch sensation is symmetric on the face, arms, or legs.  Coordination: The patient has good finger-nose-finger and heel-to-shin bilaterally. Some apraxia with use of the extremities is noted. The patient appears to have some visuospatial apraxia.   Gait and station: The requires assistance with standing. Once up, the patient is unable to stand on room, will lean backwards.  Reflexes: Deep tendon reflexes are symmetric.   Assessment/Plan:  1. Progressive dementia  2. Cerebrovascular disease, multi-infarct state  3. Gait disturbance  This patient has continued to progress significantly with her ability to ambulate, she is essentially nonambulatory. The patient has a very definite tendency to low backwards, and she will require a wheelchair for mobilization. The patient will continue the Aricept and Namenda, and she will followup in 9 months.  Jill Alexanders MD 08/28/2013 7:20 PM  [facility] [address] [place], [place] [address]  Phone [phone] Fax [phone]

## 2013-09-05 ENCOUNTER — Telehealth: Payer: Self-pay | Admitting: Gynecology

## 2013-09-05 NOTE — Telephone Encounter (Signed)
Discussed outcome of recent visit at [place] appt for [name].  No evidence of prolapse was appreciated and they will be taking a wait and see approach due to her comorbidities.  The estring was removed and not replaced. Someone will be checking periodically for recurrence of prolapse and they will contact someone if it should recur.  If they want we will be willing to replace the estring for them. Questions addressed

## 2013-09-05 NOTE — Telephone Encounter (Signed)
Spoke with son, [name], about visit with Dr. Letha Cape and Dr. Paulla Fore, who saw pt instead of Dr. Elba Barman. They found no indication of prolapse, and did not recommend doing anything at this point except just watch for future signs of prolapse. They said, "If it's not broken, don't fix it." They said that the procedure in question would be 90 minutes to 2 hours long, and risk of blood clot in someone her age was just too high. They recommended to just check 2-3 times a year for any signs of prolapse. Pt's son seemed satisfied with the recommendation they received.

## 2013-09-05 NOTE — Telephone Encounter (Signed)
Jon calling to speak with dr lathrop about patient. Did not say what for just that he needed her to call at her earliest time.

## 2013-09-07 IMAGING — CR DG ABDOMEN ACUTE W/ 1V CHEST
3 series · 3 of 3 positions shown · non-contrast
Comparison: Abdominal film on 08/27/2011

CLINICAL DATA: Nausea, vomiting and diarrhea.

ACUTE ABDOMEN SERIES (ABDOMEN 2 VIEW & CHEST 1 VIEW)

[w abdomen decub]
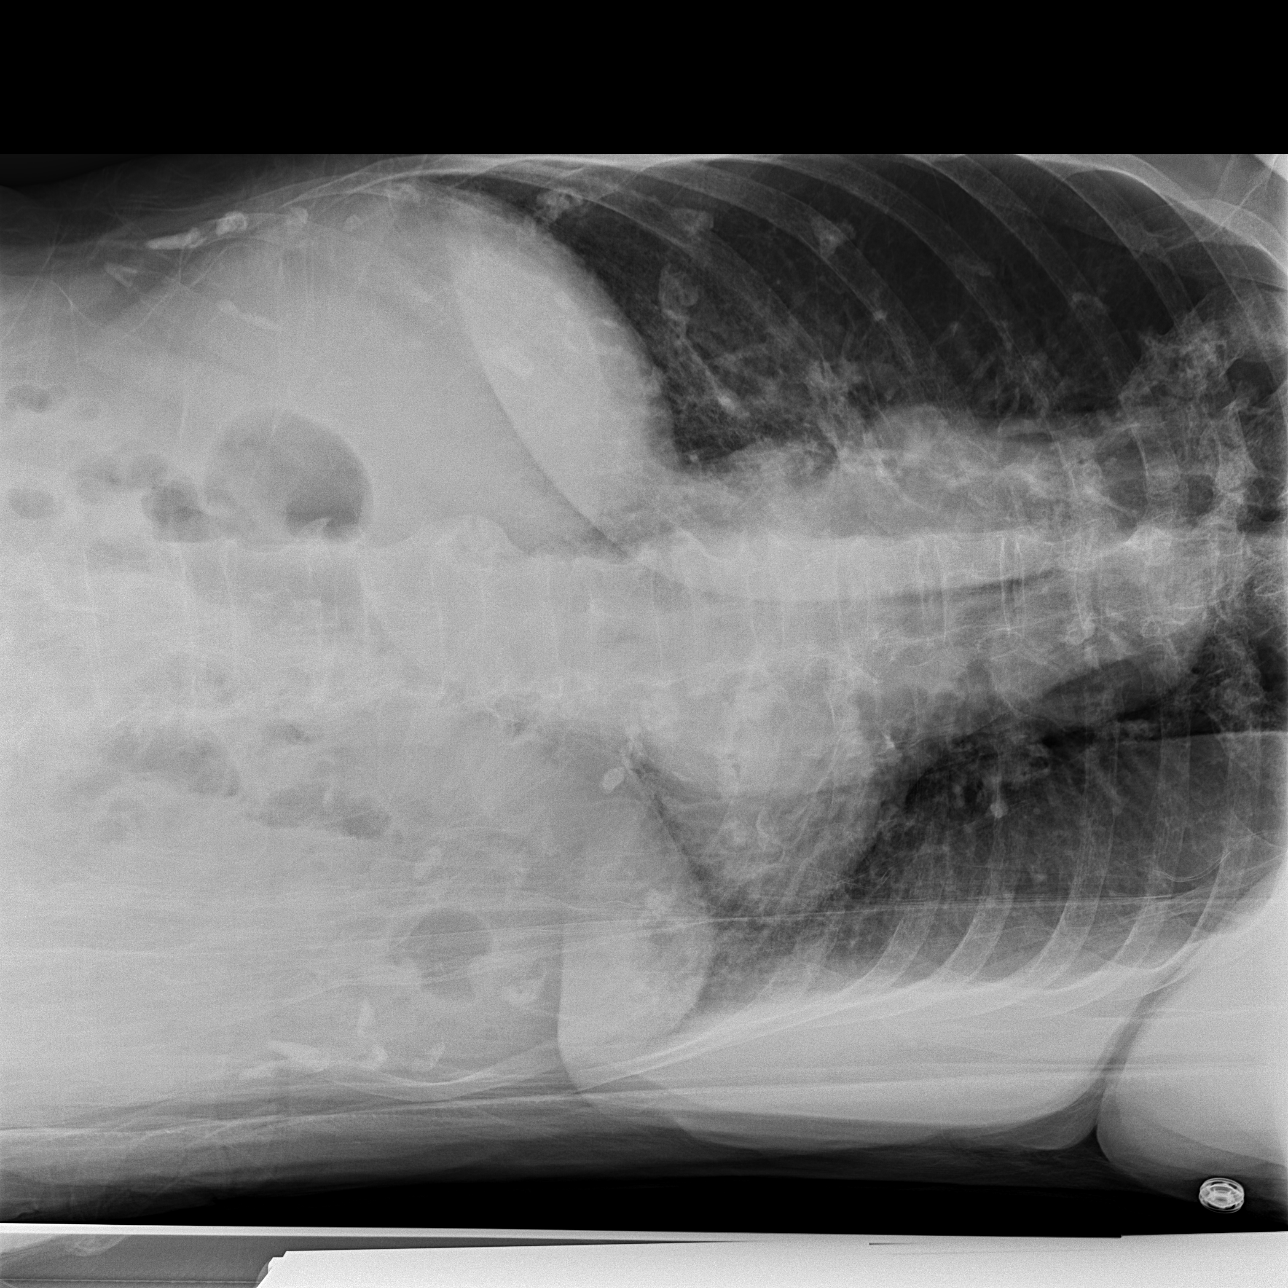

[x chest ap]
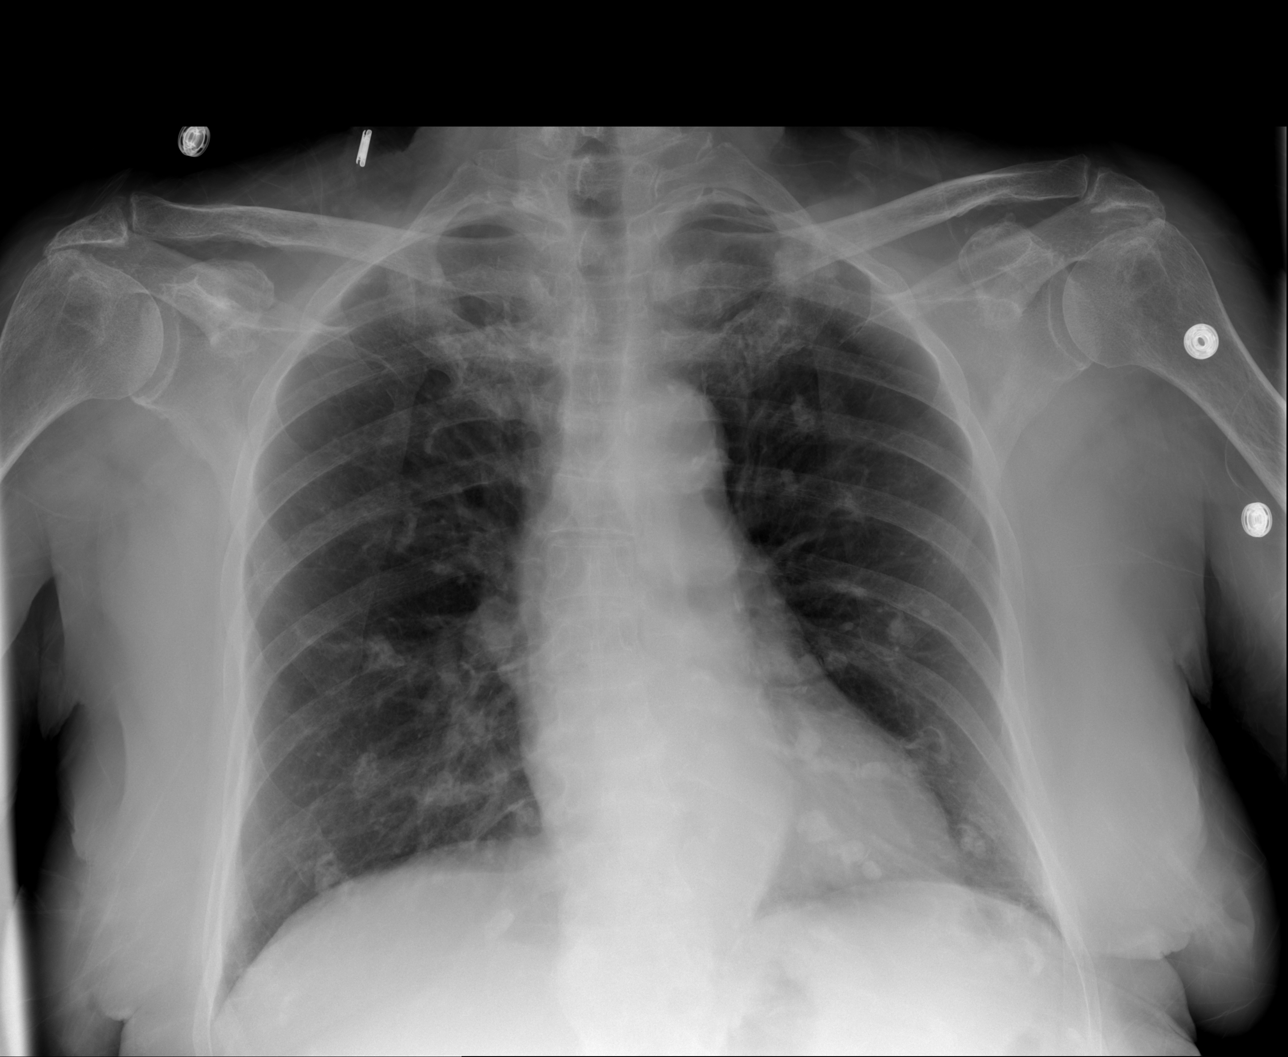

[t abdomen supine]
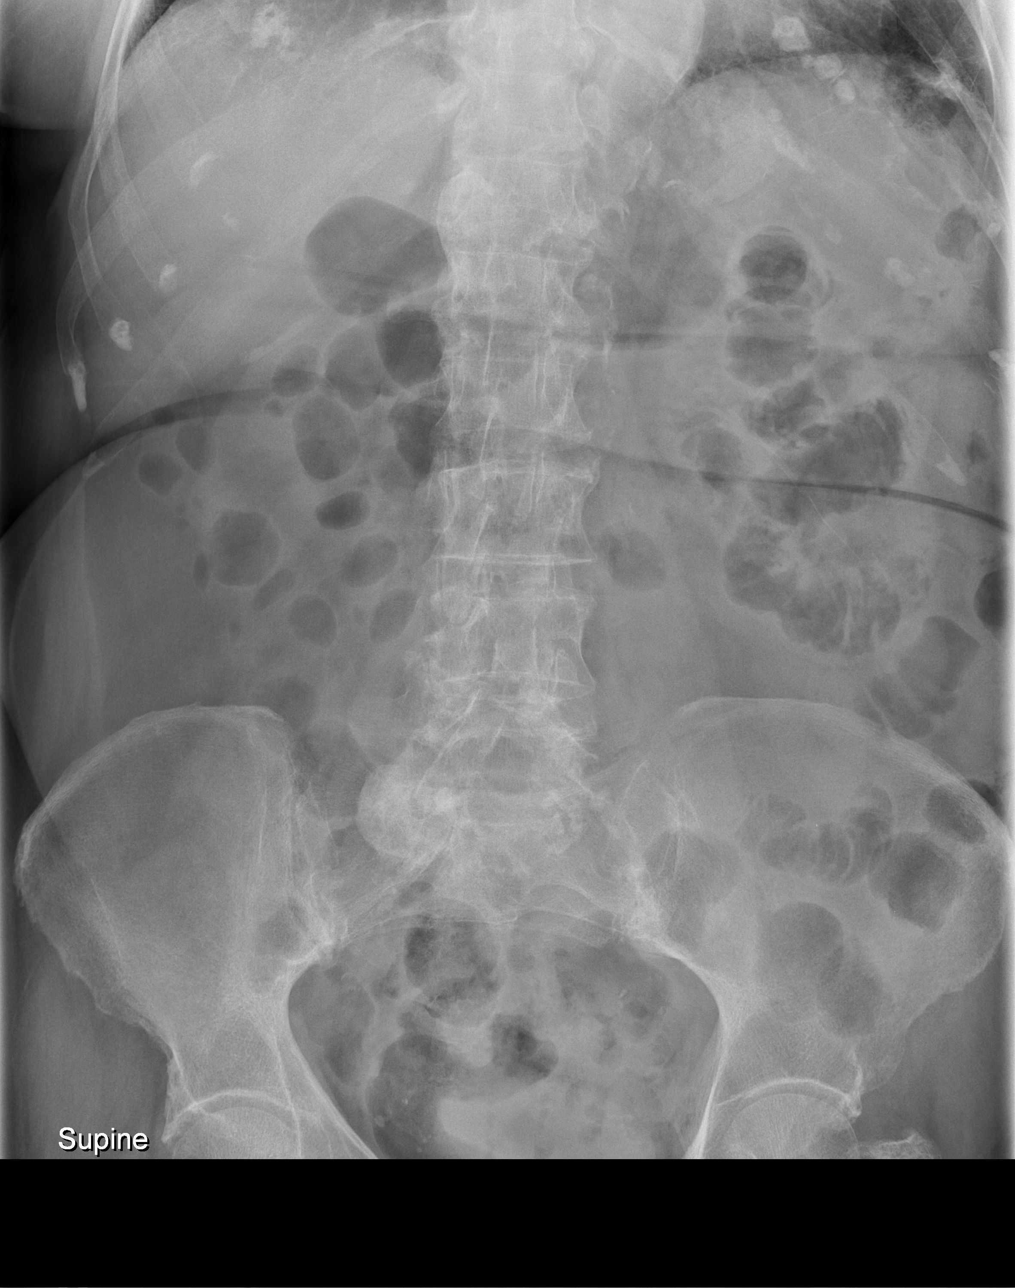

[3 of 3 positions shown; findings below may reference images not displayed]

FINDINGS: The lungs are clear and show no edema or infiltrate.
Heart size is normal.  Abdominal films show no evidence of bowel
obstruction or free intraperitoneal air.  No significant ileus is
identified.  Degenerative changes are present throughout the lumbar
spine.  Aorto-iliac atherosclerotic calcifications are present.
IMPRESSION: No acute findings.

## 2013-09-18 ENCOUNTER — Other Ambulatory Visit: Payer: Self-pay | Admitting: Neurology

## 2013-09-24 ENCOUNTER — Other Ambulatory Visit: Payer: Self-pay | Admitting: Neurology

## 2013-11-13 ENCOUNTER — Telehealth: Payer: Self-pay | Admitting: Neurology

## 2013-11-13 NOTE — Telephone Encounter (Signed)
Patient's son [name] calling to state that patient has become combative with the aides that take care of her, states that she is not physically combative but they are afraid it may escalate to that and they would like to know what to do. Please return call to patient's son and advise.

## 2013-11-14 MED ORDER — ESCITALOPRAM OXALATE 5 MG PO TABS: 5.0000 mg | ORAL_TABLET | Freq: Every day | ORAL | Status: DC

## 2013-11-14 NOTE — Telephone Encounter (Signed)
I called the son. The patient is having some issues with agitation, not wanting to go the bathroom or to take a bath. She is not had any physical aggression yet, but they are concerned that this may happen in the future. I will start her on some low-dose Lexapro.

## 2013-11-23 ENCOUNTER — Telehealth: Payer: Self-pay | Admitting: Neurology

## 2013-11-23 NOTE — Telephone Encounter (Signed)
I called the son. The patient has had increasing drowsiness, and on over several days while on the Lexapro. She is now so lethargic she is not taking in food or fluids well. They have stopped the medication today, if she does not seem to be improving by tomorrow, she may need an urgent evaluation to exclude other issues such as urinary tract infections or pneumonia. If she does improve, we may need to try another type of medication such as very low-dose Zoloft to see if this helps.

## 2013-11-23 NOTE — Telephone Encounter (Signed)
Son calling to inform Dr. Jannifer Franklin, patient sleeping a lot from taking escitalopram (LEXAPRO) 5 MG tablet.  Not staying awake long enough to eat or take in fluids.  They are discontinuing medication, feels at this point the side effect is to much for patient.

## 2013-11-23 NOTE — Telephone Encounter (Signed)
lt message for call back concerning medication side effects

## 2013-11-23 NOTE — Telephone Encounter (Signed)
Son called and he said that patient stayed in bed all day yesterday, not taking in any food or water so that they had discontinued the medication (escitalopram-Lexapro) all together to see if patient will bounce back. They are not really sure if it is the medication but it is the only thing that has changed. She is still having the same issues before starting this medicine.

## 2013-11-30 ENCOUNTER — Telehealth: Payer: Self-pay | Admitting: Neurology

## 2013-11-30 MED ORDER — SERTRALINE HCL 25 MG PO TABS: ORAL_TABLET | ORAL | Status: AC

## 2013-11-30 NOTE — Telephone Encounter (Signed)
Patient's son has called and says his mother has bounced back. She is still combative please call and advise.  This is your last phone conversation: I called the son. The patient has had increasing drowsiness, and on over several days while on the Lexapro. She is now so lethargic she is not taking in food or fluids well. They have stopped the medication today, if she does not seem to be improving by tomorrow, she may need an urgent evaluation to exclude other issues such as urinary tract infections or pneumonia. If she does improve, we may need to try another type of medication such as very low-dose Zoloft to see if this helps.

## 2013-11-30 NOTE — Telephone Encounter (Signed)
Son calling to inform Dr. Jannifer Franklin mother seems to have bounced back.  Still having issues with her being combative.  Please call and advise.

## 2013-11-30 NOTE — Telephone Encounter (Signed)
I called the son. The patient has resolved with her lethargy off of the Lexapro. I will start low-dose Zoloft taking one half of a 25 mg tablet daily. They are to call me if any problems arise. This medication can be increased with the dosage if needed.

## 2013-12-11 ENCOUNTER — Telehealth: Payer: Self-pay | Admitting: Neurology

## 2013-12-11 NOTE — Telephone Encounter (Signed)
I spoke with Judeen Hammans, hospice nurse, who has just started providing services for patient (ordered by PCP). I told nurse that I defer to the hospice team and hospice MD re: medication / symptom mgmt going forward. She will touch base with her supervising hospice MD.   Penni Bombard, MD 09/06/4237, 5:32 PM Certified in Neurology, Neurophysiology and Neuroimaging  [facility] [address], [place], [place] [address] [phone]

## 2013-12-11 NOTE — Telephone Encounter (Signed)
Hospice nurse called requesting medication to aide with the patient's combative, agitated demeanor.  She says it is very difficult to care for him.   Dr Jannifer Franklin is out of the office, forwarding to [facility] for review.  Please advise.  Thank you.   Last phone notes say: 06/08: I called the son. The patient is having some issues with agitation, not wanting to go the bathroom or to take a bath. She is not had any physical aggression yet, but they are concerned that this may happen in the future. I will start her on some low-dose Lexapro. 06/18: I called the son. The patient has had increasing drowsiness, and on over several days while on the Lexapro. She is now so lethargic she is not taking in food or fluids well. They have stopped the medication today, if she does not seem to be improving by tomorrow, she may need an urgent evaluation to exclude other issues such as urinary tract infections or pneumonia. If she does improve, we may need to try another type of medication such as very low-dose Zoloft to see if this helps. 06/25: I called the son. The patient has resolved with her lethargy off of the Lexapro. I will start low-dose Zoloft taking one half of a 25 mg tablet daily. They are to call me if any problems arise. This medication can be increased with the dosage if needed.

## 2013-12-11 NOTE — Telephone Encounter (Signed)
[name] with Hospice @ [phone], requesting symptom control management medication.  Patient very combative, agitated, and difficult to provide care.  Please call and advise.

## 2013-12-25 ENCOUNTER — Other Ambulatory Visit: Payer: Self-pay | Admitting: Neurology

## 2014-01-02 ENCOUNTER — Telehealth: Payer: Self-pay | Admitting: Nurse Practitioner

## 2014-01-02 NOTE — Telephone Encounter (Signed)
Butch Penny, RN with [facility] wanted to inform office, patient passed away at home at 11:13 am...FYI

## 2014-01-02 NOTE — Telephone Encounter (Signed)
Hospice has indicated that the patient has passed away. I will indicate this in EPIC.

## 2014-01-06 DEATH — deceased

## 2014-04-09 ENCOUNTER — Encounter: Payer: Self-pay | Admitting: Neurology

## 2014-04-30 ENCOUNTER — Ambulatory Visit: Payer: Medicare Other | Admitting: Nurse Practitioner
